# Patient Record
Sex: Male | Born: 1945 | Race: Black or African American | Hispanic: No | Marital: Married | Smoking: Never smoker
Health system: Southern US, Community
[De-identification: ages and names within clinical notes are randomized; demographics above are authoritative.]

## PROBLEM LIST (undated history)

## (undated) DIAGNOSIS — Z8711 Personal history of peptic ulcer disease: Secondary | ICD-10-CM

## (undated) DIAGNOSIS — N182 Chronic kidney disease, stage 2 (mild): Secondary | ICD-10-CM

## (undated) DIAGNOSIS — N401 Enlarged prostate with lower urinary tract symptoms: Secondary | ICD-10-CM

## (undated) DIAGNOSIS — I119 Hypertensive heart disease without heart failure: Secondary | ICD-10-CM

## (undated) DIAGNOSIS — K5730 Diverticulosis of large intestine without perforation or abscess without bleeding: Secondary | ICD-10-CM

## (undated) DIAGNOSIS — I5189 Other ill-defined heart diseases: Secondary | ICD-10-CM

## (undated) DIAGNOSIS — K219 Gastro-esophageal reflux disease without esophagitis: Secondary | ICD-10-CM

## (undated) DIAGNOSIS — E876 Hypokalemia: Secondary | ICD-10-CM

## (undated) DIAGNOSIS — N400 Enlarged prostate without lower urinary tract symptoms: Secondary | ICD-10-CM

## (undated) DIAGNOSIS — K429 Umbilical hernia without obstruction or gangrene: Secondary | ICD-10-CM

## (undated) DIAGNOSIS — F4310 Post-traumatic stress disorder, unspecified: Secondary | ICD-10-CM

## (undated) DIAGNOSIS — N529 Male erectile dysfunction, unspecified: Secondary | ICD-10-CM

## (undated) DIAGNOSIS — F329 Major depressive disorder, single episode, unspecified: Secondary | ICD-10-CM

## (undated) DIAGNOSIS — E785 Hyperlipidemia, unspecified: Secondary | ICD-10-CM

## (undated) DIAGNOSIS — K760 Fatty (change of) liver, not elsewhere classified: Secondary | ICD-10-CM

## (undated) DIAGNOSIS — F1010 Alcohol abuse, uncomplicated: Secondary | ICD-10-CM

## (undated) DIAGNOSIS — M1990 Unspecified osteoarthritis, unspecified site: Secondary | ICD-10-CM

## (undated) DIAGNOSIS — I10 Essential (primary) hypertension: Secondary | ICD-10-CM

## (undated) HISTORY — DX: Chronic kidney disease, stage 2 (mild): N18.2

## (undated) HISTORY — DX: Fatty (change of) liver, not elsewhere classified: K76.0

## (undated) HISTORY — DX: Other ill-defined heart diseases: I51.89

## (undated) HISTORY — DX: Hypokalemia: E87.6

## (undated) HISTORY — DX: Hypertensive heart disease without heart failure: I11.9

## (undated) HISTORY — PX: HERNIA REPAIR: SHX51

## (undated) HISTORY — PX: KNEE SURGERY: SHX244

## (undated) HISTORY — DX: Essential (primary) hypertension: I10

---

## 1998-03-04 HISTORY — PX: INGUINAL HERNIA REPAIR: SUR1180

## 2015-03-05 HISTORY — PX: VITRECTOMY: SHX106

## 2015-08-17 ENCOUNTER — Observation Stay (HOSPITAL_BASED_OUTPATIENT_CLINIC_OR_DEPARTMENT_OTHER)
Admission: EM | Admit: 2015-08-17 | Discharge: 2015-08-18 | Disposition: A | Discharge status: Home / Self Care | Payer: Medicare HMO | Attending: Internal Medicine | Admitting: Internal Medicine

## 2015-08-17 ENCOUNTER — Emergency Department (HOSPITAL_BASED_OUTPATIENT_CLINIC_OR_DEPARTMENT_OTHER): Payer: Medicare HMO

## 2015-08-17 ENCOUNTER — Encounter (HOSPITAL_BASED_OUTPATIENT_CLINIC_OR_DEPARTMENT_OTHER): Payer: Self-pay | Admitting: *Deleted

## 2015-08-17 DIAGNOSIS — E876 Hypokalemia: Secondary | ICD-10-CM

## 2015-08-17 DIAGNOSIS — R0789 Other chest pain: Principal | ICD-10-CM | POA: Insufficient documentation

## 2015-08-17 DIAGNOSIS — N400 Enlarged prostate without lower urinary tract symptoms: Secondary | ICD-10-CM | POA: Diagnosis not present

## 2015-08-17 DIAGNOSIS — Z79899 Other long term (current) drug therapy: Secondary | ICD-10-CM | POA: Diagnosis not present

## 2015-08-17 DIAGNOSIS — R112 Nausea with vomiting, unspecified: Secondary | ICD-10-CM | POA: Insufficient documentation

## 2015-08-17 DIAGNOSIS — R739 Hyperglycemia, unspecified: Secondary | ICD-10-CM | POA: Diagnosis not present

## 2015-08-17 DIAGNOSIS — R079 Chest pain, unspecified: Secondary | ICD-10-CM | POA: Diagnosis present

## 2015-08-17 DIAGNOSIS — I10 Essential (primary) hypertension: Secondary | ICD-10-CM

## 2015-08-17 DIAGNOSIS — E785 Hyperlipidemia, unspecified: Secondary | ICD-10-CM | POA: Insufficient documentation

## 2015-08-17 DIAGNOSIS — R072 Precordial pain: Secondary | ICD-10-CM

## 2015-08-17 DIAGNOSIS — N289 Disorder of kidney and ureter, unspecified: Secondary | ICD-10-CM | POA: Diagnosis not present

## 2015-08-17 DIAGNOSIS — R61 Generalized hyperhidrosis: Secondary | ICD-10-CM | POA: Insufficient documentation

## 2015-08-17 HISTORY — DX: Hyperlipidemia, unspecified: E78.5

## 2015-08-17 HISTORY — DX: Benign prostatic hyperplasia without lower urinary tract symptoms: N40.0

## 2015-08-17 LAB — TROPONIN I
Troponin I: <0.03 ng/mL (ref <0.031)
Troponin I: <0.03 ng/mL (ref <0.031)

## 2015-08-17 LAB — COMPREHENSIVE METABOLIC PANEL
ALBUMIN: 4.1 g/dL (ref 3.5–5.0)
ALT: 21 U/L (ref 17–63)
AST: 21 U/L (ref 15–41)
Alkaline Phosphatase: 49 U/L (ref 38–126)
Anion gap: 9 (ref 5–15)
BILIRUBIN TOTAL: 1.1 mg/dL (ref 0.3–1.2)
BUN: 21 mg/dL — AB (ref 6–20)
CHLORIDE: 108 mmol/L (ref 101–111)
CO2: 20 mmol/L — ABNORMAL LOW (ref 22–32)
CREATININE: 1.33 mg/dL — AB (ref 0.61–1.24)
Calcium: 8.9 mg/dL (ref 8.9–10.3)
GFR calc Af Amer: >60 mL/min (ref >60)
GFR, EST NON AFRICAN AMERICAN: 53 mL/min — AB (ref >60)
GLUCOSE: 110 mg/dL — AB (ref 65–99)
Potassium: 3.3 mmol/L — ABNORMAL LOW (ref 3.5–5.1)
Sodium: 137 mmol/L (ref 135–145)
TOTAL PROTEIN: 7.7 g/dL (ref 6.5–8.1)

## 2015-08-17 LAB — CBC
HCT: 39.5 % (ref 39.0–52.0)
HEMOGLOBIN: 14.3 g/dL (ref 13.0–17.0)
MCH: 32.5 pg (ref 26.0–34.0)
MCHC: 36.2 g/dL — ABNORMAL HIGH (ref 30.0–36.0)
MCV: 89.8 fL (ref 78.0–100.0)
Platelets: 247 10*3/uL (ref 150–400)
RBC: 4.4 MIL/uL (ref 4.22–5.81)
RDW: 12.3 % (ref 11.5–15.5)
WBC: 4.6 10*3/uL (ref 4.0–10.5)

## 2015-08-17 LAB — LIPASE, BLOOD: LIPASE: 25 U/L (ref 11–51)

## 2015-08-17 MED ORDER — ASPIRIN EC 325 MG PO TBEC: 325.0000 mg | DELAYED_RELEASE_TABLET | Freq: Every day | ORAL | Status: DC

## 2015-08-17 MED ORDER — CARVEDILOL 6.25 MG PO TABS: 6.2500 mg | ORAL_TABLET | Freq: Two times a day (BID) | ORAL | Status: DC

## 2015-08-17 MED ORDER — ATORVASTATIN CALCIUM 80 MG PO TABS
80.0000 mg | ORAL_TABLET | Freq: Every day | ORAL | Status: DC
  Administered 2015-08-17: 80 mg via ORAL
  Filled 2015-08-17: qty 1

## 2015-08-17 MED ORDER — ASPIRIN 81 MG PO CHEW
324.0000 mg | CHEWABLE_TABLET | Freq: Once | ORAL | Status: AC
  Administered 2015-08-17: 324 mg via ORAL
  Filled 2015-08-17: qty 4

## 2015-08-17 MED ORDER — AMLODIPINE BESYLATE 10 MG PO TABS
10.0000 mg | ORAL_TABLET | Freq: Every day | ORAL | Status: DC
  Administered 2015-08-18: 10 mg via ORAL
  Filled 2015-08-17: qty 1

## 2015-08-17 MED ORDER — NITROGLYCERIN 2 % TD OINT
1.0000 [in_us] | TOPICAL_OINTMENT | Freq: Three times a day (TID) | TRANSDERMAL | Status: DC
  Administered 2015-08-17 – 2015-08-18 (×2): 1 [in_us] via TOPICAL
  Filled 2015-08-17: qty 30

## 2015-08-17 MED ORDER — ENOXAPARIN SODIUM 80 MG/0.8ML ~~LOC~~ SOLN
1.0000 mg/kg | Freq: Once | SUBCUTANEOUS | Status: AC
Start: 2015-08-17 — End: 2015-08-17
  Administered 2015-08-17: 80 mg via SUBCUTANEOUS
  Filled 2015-08-17: qty 0.8

## 2015-08-17 MED ORDER — SODIUM CHLORIDE 0.9% FLUSH
3.0000 mL | Freq: Two times a day (BID) | INTRAVENOUS | Status: DC
  Administered 2015-08-17 – 2015-08-18 (×2): 3 mL via INTRAVENOUS

## 2015-08-17 MED ORDER — MORPHINE SULFATE (PF) 2 MG/ML IV SOLN: 1.0000 mg | INTRAVENOUS | Status: DC | PRN

## 2015-08-17 MED ORDER — ACETAMINOPHEN 650 MG RE SUPP
650.0000 mg | Freq: Four times a day (QID) | RECTAL | Status: DC | PRN
Start: 2015-08-17 — End: 2015-08-18

## 2015-08-17 MED ORDER — SODIUM CHLORIDE 0.9 % IV SOLN
INTRAVENOUS | Status: AC
  Administered 2015-08-17: 22:00:00 via INTRAVENOUS

## 2015-08-17 MED ORDER — TAMSULOSIN HCL 0.4 MG PO CAPS
0.4000 mg | ORAL_CAPSULE | Freq: Every day | ORAL | Status: DC
  Administered 2015-08-17: 0.4 mg via ORAL
  Filled 2015-08-17: qty 1

## 2015-08-17 MED ORDER — ACETAMINOPHEN 325 MG PO TABS: 650.0000 mg | ORAL_TABLET | Freq: Four times a day (QID) | ORAL | Status: DC | PRN

## 2015-08-17 MED ORDER — NITROGLYCERIN 0.4 MG SL SUBL
0.4000 mg | SUBLINGUAL_TABLET | SUBLINGUAL | Status: DC | PRN
  Administered 2015-08-17: 0.4 mg via SUBLINGUAL
  Filled 2015-08-17: qty 1

## 2015-08-17 MED ORDER — SODIUM CHLORIDE 0.9 % IV BOLUS (SEPSIS)
1000.0000 mL | Freq: Once | INTRAVENOUS | Status: AC
  Administered 2015-08-17: 1000 mL via INTRAVENOUS

## 2015-08-17 NOTE — ED Provider Notes (Signed)
CSN: [id number]     Arrival date & time 08/17/15  1446 History   First MD Initiated Contact with Patient 08/17/15 1508     Chief Complaint  Patient presents with  . Chest Pain     (Consider location/radiation/quality/duration/timing/severity/associated sxs/prior Treatment) HPI  70 year old male with a history of hypertension presents with chest pain/upper abdominal pain. Has been intermittent over the last 4 days. Today at around 1 or 1:30 PM he was at work and all of a sudden developed 7/10 pressure in his abdomen and back. He felt nauseated and vomited once and broke out into a sweat. No shortness of breath. Nothing specific seems to make the pain come and go over these last couple days. Not related to exertion. Pain seems to radiate around to his back. Patient states that it usually occurs at least 2 hours or more after eating. Never had this before. Prior history of hyperlipidemia but has been taken off his meds by his PCP. No history of diabetes or smoking. No early coronary disease in the family.  Past Medical History  Diagnosis Date  . Hypertension   . Enlarged prostate    Past Surgical History  Procedure Laterality Date  . Hernia repair     No family history on file. Social History  Substance Use Topics  . Smoking status: Never Smoker   . Smokeless tobacco: None  . Alcohol Use: Yes     Comment: weekends    Review of Systems  Constitutional: Positive for diaphoresis.  Respiratory: Negative for shortness of breath.   Cardiovascular: Positive for chest pain.  Gastrointestinal: Positive for vomiting and abdominal pain.  Musculoskeletal: Positive for back pain.  All other systems reviewed and are negative.     Allergies  Review of patient's allergies indicates not on file.  Home Medications   Prior to Admission medications   Not on File   BP 152/79 mmHg  Pulse 76  Temp(Src) 98.2 F (36.8 C) (Oral)  Resp 18  Ht  (1.702 m)  Wt 174 lb (78.926 kg)  BMI  27.25 kg/m2  SpO2 100% Physical Exam  Constitutional: He is oriented to person, place, and time. He appears well-developed and well-nourished. No distress.  HENT:  Head: Normocephalic and atraumatic.  Right Ear: External ear normal.  Left Ear: External ear normal.  Nose: Nose normal.  Eyes: Right eye exhibits no discharge. Left eye exhibits no discharge.  Neck: Neck supple.  Cardiovascular: Normal rate, regular rhythm, normal heart sounds and intact distal pulses.   Pulses:      Radial pulses are 2+ on the right side, and 2+ on the left side.  Pulmonary/Chest: Effort normal and breath sounds normal. He exhibits no tenderness.  Abdominal: Soft. There is tenderness in the epigastric area.  Musculoskeletal: He exhibits no edema.  Neurological: He is alert and oriented to person, place, and time.  Skin: Skin is warm and dry. He is not diaphoretic.  Nursing note and vitals reviewed.   ED Course  Procedures (including critical care time) Labs Review Labs Reviewed  CBC - Abnormal; Notable for the following:    MCHC 36.2 (*)    All other components within normal limits  COMPREHENSIVE METABOLIC PANEL - Abnormal; Notable for the following:    Potassium 3.3 (*)    CO2 20 (*)    Glucose, Bld 110 (*)    BUN 21 (*)    Creatinine, Ser 1.33 (*)    GFR calc non Af Amer 53 (*)  All other components within normal limits  TROPONIN I  LIPASE, BLOOD    Imaging Review Dg Chest 2 View  08/17/2015  CLINICAL DATA:  Mid chest pain for 3 days.  History of hypertension. EXAM: CHEST  2 VIEW COMPARISON:  None. FINDINGS: Heart size is normal. Overall cardiomediastinal silhouette is within normal limits. Pulmonary arteries are mildly prominent, possibly chronic pulmonary artery hypertension. Lungs are clear. No pleural effusion or pneumothorax seen. Osseous and soft tissue structures about the chest are unremarkable. IMPRESSION: 1. No acute findings.  Lungs are clear.  Heart size is normal. 2.  Questionable prominence of the pulmonary arteries, raising the possibility of a chronic pulmonary artery hypertension. Electronically Signed   By: Bary RichardStan  Maynard M.D.   On: 08/17/2015 15:53   [place] Abdomen Limited Ruq  08/17/2015  CLINICAL DATA:  Mid chest pain for 3 days. EXAM: US ABDOMEN LIMITED - RIGHT UPPER QUADRANT COMPARISON:  None. FINDINGS: Gallbladder: No gallstones seen. There is no gallbladder wall thickening, pericholecystic fluid or other secondary sonographic signs of acute cholecystitis. No sonographic Murphy's sign elicited during the examination, per the sonographer. Common bile duct: Diameter: Normal at 3 mm Liver: Diffusely echogenic suggesting fatty infiltration. Small circumscribed nearly anechoic masses are seen within the liver, most suggestive of benign cysts. There are no suspicious masses demonstrated within the liver. IMPRESSION: 1. Fatty infiltration of the liver. 2. No acute findings. Gallbladder appears normal. No bile duct dilatation. Electronically Signed   By: Bary RichardStan  Maynard M.D.   On: 08/17/2015 15:56   I have personally reviewed and evaluated these images and lab results as part of my medical decision-making.   EKG Interpretation   Date/Time:  Thursday August 17 2015 15:05:56 EDT Ventricular Rate:  79 PR Interval:  144 QRS Duration: 95 QT Interval:  377 QTC Calculation: 432 R Axis:   -6 Text Interpretation:  Sinus rhythm Probable left atrial enlargement  Probable anteroseptal infarct, recent nonspecific T waves No old tracing  to compare Confirmed by Lantz Hermann MD, Takashi Korol [phone]) on 08/17/2015 3:08:39 PM       EKG Interpretation  Date/Time:  Thursday August 17 2015 16:08:07 EDT Ventricular Rate:  78 PR Interval:  141 QRS Duration: 96 QT Interval:  382 QTC Calculation: 435 R Axis:   -18 Text Interpretation:  Sinus rhythm Probable left atrial enlargement Left ventricular hypertrophy Anterior Q waves, possibly due to LVH no significant change since earlier in the  day Confirmed by Adriell Polansky MD, Mareon Robinette ([id number]) on 08/17/2015 4:12:30 PM       MDM   Final diagnoses:  Chest pain    Patient's pain resolved with nitroglycerin and aspirin. Nonspecific changes on EKG but no old to compare to. No changes noted throughout ED stay. Could be gastric in nature given epigastric nature but with risk factors including age, nonspecific ECG, and hypertension I think he needs an ACS rule out. Especially with his vomiting and diaphoresis today. Discussed with Dr. Randol KernElgergawy, who accepts in admission and [facility]. Patient is currently pain-free in the ED.    Pricilla LovelessScott Nilza Eaker, MD 08/17/15 [phone]

## 2015-08-17 NOTE — ED Notes (Signed)
PResents with sternal chest pain began yesterday, pain is intermittent described as burning at first and then a pressure with radiation into shoulder blades, diaphoresis and nausea. Pain is worse with exertion and better with rest.

## 2015-08-17 NOTE — H&P (Addendum)
TRH H&P   Patient Demographics:    Joshua Solis, is a 70 y.o. male  MRN: [id number]   DOB - 01-08-1946  Admit Date - 08/17/2015  Outpatient Primary MD for the patient is No PCP Per Patient  Dr. Sammuel Bailiff  Referring MD/NP/PA:  Pricilla Loveless  Outpatient Specialists:  none  Patient coming from: home, ED at [facility]  Chief Complaint  Patient presents with  . Chest Pain      HPI:    Joshua Solis  is a 70 y.o. male, w hypertension, hyperlipidemia, apparently c/o cp x3 days.  Pt states that had cp starting at about 1:30pm today. SSCP " pressure"  With radiation to the neck associated with n/v x1.  Prior to that it would radiate around the right side to his shoulder blade.  Denies fever, chills, cough, palp, sob.  Sitting made the pain better. Pt was putting together a chair when the pain came on today at 1:30pm.  Took a couple of tums without relief.  + diaphoresis,  Pain lasted about 20 minutes.  Pt drove to the ED and tx with nitroglycerin, and aspirin.  Pt had relief after taking nitro.  and was sent for admission for cp.  EKG showed nsr at 80, nl axis, Q in V1, V2, ST elevation in V1, V2.     Review of systems:    In addition to the HPI above, No Fever-chills, No Headache, No changes with Vision or hearing, No problems swallowing food or Liquids, NoCough or Shortness of Breath, No Abdominal pain, No Nausea or Vommitting, Bowel movements are regular, No Blood in stool or Urine, No dysuria, No new skin rashes or bruises, No new joints pains-aches,  No new weakness, tingling, numbness in any extremity, No recent weight gain or loss, No polyuria, polydypsia or polyphagia, No significant Mental Stressors.  A full 10 point Review of Systems was done, except as stated above, all other Review of Systems were negative.   With Past History of the following :     Past Medical History  Diagnosis Date  . Hypertension   . Enlarged prostate   . Hyperlipidemia       Past Surgical History  Procedure Laterality Date  . Hernia repair        Social History:     Social History  Substance Use Topics  . Smoking status: Never Smoker   . Smokeless tobacco: Not on file  . Alcohol Use: 1.2 oz/week    2 Standard drinks or equivalent per week     Comment: weekends     Lives - at home  Mobility - walks without assistance   Family History :     Family History  Problem Relation Age of Onset  . Stroke Mother   . Pneumonia Mother       Home Medications:   Prior to  Admission medications   Medication Sig Start Date End Date Taking? Authorizing Provider  tamsulosin (FLOMAX) 0.4 MG CAPS capsule Take 0.4 mg by mouth.   Yes Historical Provider, MD     Allergies:    No Known Allergies   Physical Exam:   Vitals  Blood pressure 154/84, pulse 62, temperature 98.6 F (37 C), temperature source Oral, resp. rate 18, height  (1.727 m), weight 79.833 kg (176 lb), SpO2 100 %.   1. General  lying in bed in NAD,    2. Normal affect and insight, Not Suicidal or Homicidal, Awake Alert, Oriented X 3.  3. No F.N deficits, ALL C.Nerves Intact, Strength 5/5 all 4 extremities, Sensation intact all 4 extremities, Plantars down going.  4. Ears and Eyes appear Normal, Conjunctivae clear, PERRLA. Moist Oral Mucosa.  5. Supple Neck, No JVD, No cervical lymphadenopathy appriciated, No Carotid Bruits.  6. Symmetrical Chest wall movement, Good air movement bilaterally, CTAB.  7. RRR, No Gallops, Rubs or Murmurs, No Parasternal Heave.  8. Positive Bowel Sounds, Abdomen Soft, No tenderness, No organomegaly appriciated,No rebound -guarding or rigidity.  9.  No Cyanosis, Normal Skin Turgor, No Skin Rash or Bruise.  10. Good muscle tone,  joints appear normal , no effusions, Normal ROM.  11. No Palpable Lymph Nodes in Neck or Axilla     Data  Review:    CBC  Recent Labs Lab 08/17/15 1514  WBC 4.6  HGB 14.3  HCT 39.5  PLT 247  MCV 89.8  MCH 32.5  MCHC 36.2*  RDW 12.3   ------------------------------------------------------------------------------------------------------------------  Chemistries   Recent Labs Lab 08/17/15 1514  NA 137  K 3.3*  CL 108  CO2 20*  GLUCOSE 110*  BUN 21*  CREATININE 1.33*  CALCIUM 8.9  AST 21  ALT 21  ALKPHOS 49  BILITOT 1.1   ------------------------------------------------------------------------------------------------------------------ estimated creatinine clearance is 50.7 mL/min (by C-G formula based on Cr of 1.33). ------------------------------------------------------------------------------------------------------------------ No results for input(s): TSH, T4TOTAL, T3FREE, THYROIDAB in the last 72 hours.  Invalid input(s): FREET3  Coagulation profile No results for input(s): INR, PROTIME in the last 168 hours. ------------------------------------------------------------------------------------------------------------------- No results for input(s): DDIMER in the last 72 hours. -------------------------------------------------------------------------------------------------------------------  Cardiac Enzymes  Recent Labs Lab 08/17/15 1512  TROPONINI <0.03   ------------------------------------------------------------------------------------------------------------------ No results found for: BNP   ---------------------------------------------------------------------------------------------------------------  Urinalysis No results found for: COLORURINE, APPEARANCEUR, LABSPEC, PHURINE, GLUCOSEU, HGBUR, BILIRUBINUR, KETONESUR, PROTEINUR, UROBILINOGEN, NITRITE, LEUKOCYTESUR  ----------------------------------------------------------------------------------------------------------------   Imaging Results:    Dg Chest 2 View  08/17/2015  CLINICAL DATA:  Mid  chest pain for 3 days.  History of hypertension. EXAM: CHEST  2 VIEW COMPARISON:  None. FINDINGS: Heart size is normal. Overall cardiomediastinal silhouette is within normal limits. Pulmonary arteries are mildly prominent, possibly chronic pulmonary artery hypertension. Lungs are clear. No pleural effusion or pneumothorax seen. Osseous and soft tissue structures about the chest are unremarkable. IMPRESSION: 1. No acute findings.  Lungs are clear.  Heart size is normal. 2. Questionable prominence of the pulmonary arteries, raising the possibility of a chronic pulmonary artery hypertension. Electronically Signed   By: Bary Richard M.D.   On: 08/17/2015 15:53   US Abdomen Limited Ruq  08/17/2015  CLINICAL DATA:  Mid chest pain for 3 days. EXAM: US ABDOMEN LIMITED - RIGHT UPPER QUADRANT COMPARISON:  None. FINDINGS: Gallbladder: No gallstones seen. There is no gallbladder wall thickening, pericholecystic fluid or other secondary sonographic signs of acute cholecystitis. No sonographic Murphy's sign elicited during the examination,  per the sonographer. Common bile duct: Diameter: Normal at 3 mm Liver: Diffusely echogenic suggesting fatty infiltration. Small circumscribed nearly anechoic masses are seen within the liver, most suggestive of benign cysts. There are no suspicious masses demonstrated within the liver. IMPRESSION: 1. Fatty infiltration of the liver. 2. No acute findings. Gallbladder appears normal. No bile duct dilatation. Electronically Signed   By: Bary RichardStan  Maynard M.D.   On: 08/17/2015 15:56    My personal review of EKG: Rhythm NSR, Rate  80 / nl axis, qv1, v2, and st elevation in v1, v2   Assessment & Plan:    Principal Problem:   Chest pain Active Problems:   Essential hypertension   Renal insufficiency   Hypokalemia    1. Cp responsive to nitro Tele Trop i q6h x3 Cardiology consultation Aspirin Start Lipitor 80mg  po qhs No beta blocker due to hr low Cont amlodipine 10mg  po  qday Nitro paste 1 inch topically tid Lovenox  2. Hypokalemia Replete  3. Renal insufficiiency Hydrate with ns iv Check cmp in am  4.  Hyperglycemia Check hga1c, lipid  5.  N/v  protonix zofran prn  DVT Prophylaxis Lovenox-  SCDs  AM Labs Ordered, also please review Full Orders  Family Communication: Admission, patients condition and plan of care including tests being ordered have been discussed with the patient  who indicate understanding and agree with the plan and Code Status.  Code Status FULL CODE  Likely DC to  home  Condition GUARDED    Consults called: cardiology  Admission status: inpatient  Time spent in minutes : 45 minutes   Pearson GrippeJames Elaine Middleton M.D on 08/17/2015 at 7:18 PM  Between 7am to 7pm - Pager - [phone]. After 7pm go to [email or web address] - password [email or web address]  [facility] - Office  [phone]

## 2015-08-17 NOTE — ED Notes (Signed)
Pt's actual arrival was 14:48. EKG and vitals performed at 14:53, however; the chart the pt was admitted with was under the wrong MRN. Charge nurse made aware, issue corrected.

## 2015-08-17 NOTE — Progress Notes (Signed)
Pt to room, with no c/o CP. Meal ordered.

## 2015-08-17 NOTE — Progress Notes (Signed)
70 year old male presents with complaints of chest/epigastric pain, nausea and vomiting with diaphoresis, negative troponins, chest pain relieved by nitroglycerin, currently chest pain-free, EKG with nonspecific T waves, accepted to telemetry for chest pain workup. Huey Bienenstockawood Karin Pinedo MD

## 2015-08-17 NOTE — Consult Note (Signed)
CARDIOLOGY CONSULT NOTE  Patient ID: Joshua Solis MRN: [id number] DOB/AGE: May 26, 1945 70 y.o.  Admit date: 08/17/2015 Primary Physician No PCP Per Patient  [facility] Primary Cardiologist None Chief Complaint  Chest pain. Requesting  Dr. Selena Batten  HPI:  The patient presents for evaluation of chest pain.  He has no prior cardiac history.  He reports that since this weekend he has had intermittent chest pain.  This was an upper epigastric and lower sternal burning.  It would be moderate in intensity and would come and go at rest.  He has an active job and could not bring this on with that activity.  He took antacids and Aleve and there was not be a noticeable difference.  Today at work he felt some discomfort and actually had vomiting.  He had a sensation in his throat prior to this that was a feeling like he needed to throw up.  He had diaphoresis.  He did feel better after vomiting but he decided to come to the ED to get checked out.  He has had negative cardiac enzymes.  EKG shows a probable repolarization patter.  He has otherwise been feeling well and has recently been active without any symptoms.   He has never had this discomfort before.   Past Medical History  Diagnosis Date  . Hypertension   . Enlarged prostate   . Hyperlipidemia     Past Surgical History  Procedure Laterality Date  . Hernia repair      No Known Allergies Prescriptions prior to admission  Medication Sig Dispense Refill Last Dose  . amLODipine (NORVASC) 10 MG tablet Take 10 mg by mouth daily.   08/17/2015 at Unknown time  . tamsulosin (FLOMAX) 0.4 MG CAPS capsule Take 0.4 mg by mouth.      Family History  Problem Relation Age of Onset  . Stroke Mother   . Pneumonia Mother     Social History   Social History  . Marital Status: Married    Spouse Name: N/A  . Number of Children: N/A  . Years of Education: N/A   Occupational History  . Not on file.   Social History Main Topics  . Smoking status: Never Smoker    . Smokeless tobacco: Not on file  . Alcohol Use: 1.2 oz/week    2 Standard drinks or equivalent per week     Comment: weekends  . Drug Use: No  . Sexual Activity: Not on file   Other Topics Concern  . Not on file   Social History Narrative  . No narrative on file     ROS:    As stated in the HPI and negative for all other systems.  Physical Exam: Blood pressure 154/84, pulse 62, temperature 98.6 F (37 C), temperature source Oral, resp. rate 18, height  (1.727 m), weight 176 lb (79.833 kg), SpO2 100 %.  GENERAL:  Well appearing HEENT:  Pupils equal round and reactive, fundi not visualized, oral mucosa unremarkable NECK:  No jugular venous distention, waveform within normal limits, carotid upstroke brisk and symmetric, no bruits, no thyromegaly LYMPHATICS:  No cervical, inguinal adenopathy LUNGS:  Clear to auscultation bilaterally BACK:  No CVA tenderness CHEST:  Unremarkable HEART:  PMI not displaced or sustained,S1 and S2 within normal limits, no S3, no S4, no clicks, no rubs, no murmurs ABD:  Flat, positive bowel sounds normal in frequency in pitch, no bruits, no rebound, no guarding, no midline pulsatile mass, no hepatomegaly, no splenomegaly EXT:  2 plus pulses throughout, no edema, no cyanosis no clubbing SKIN:  No rashes no nodules NEURO:  Cranial nerves II through XII grossly intact, motor grossly intact throughout PSYCH:  Cognitively intact, oriented to person place and time   Labs: Lab Results  Component Value Date   BUN 21* 08/17/2015   Lab Results  Component Value Date   CREATININE 1.33* 08/17/2015   Lab Results  Component Value Date   NA 137 08/17/2015   K 3.3* 08/17/2015   CL 108 08/17/2015   CO2 20* 08/17/2015   Lab Results  Component Value Date   TROPONINI <0.03 08/17/2015   Lab Results  Component Value Date   WBC 4.6 08/17/2015   HGB 14.3 08/17/2015   HCT 39.5 08/17/2015   MCV 89.8 08/17/2015   PLT 247 08/17/2015   No results found  for: CHOL, HDL, LDLCALC, LDLDIRECT, TRIG, CHOLHDL Lab Results  Component Value Date   ALT 21 08/17/2015   AST 21 08/17/2015   ALKPHOS 49 08/17/2015   BILITOT 1.1 08/17/2015      Radiology:   CXR: 1. No acute findings. Lungs are clear. Heart size is normal. 2. Questionable prominence of the pulmonary arteries, raising the possibility of a chronic pulmonary artery hypertension.  EKG:  NSR, rate 78,  LAD, diffuse anterolateral ST elevation nonspecific.  08/17/2015  ASSESSMENT AND PLAN:   CHEST PAIN:  The pain has atypical greater than typical features.  No objective evidence of ischemia.  He does have cardiovascular risk factors.   I will bring the patient back for a POET (Plain Old Exercise Test). This will allow me to screen for obstructive coronary disease, risk stratify and very importantly provide a prescription for exercise.  HTN:  BP is up slightly but he reports that this is not usual.  He will follow this at home.    CKD:  Mild.  Follow.  Might be related to HTN   Signed: Rollene RotundaJames Abdirizak Richison 08/17/2015, 8:17 PM

## 2015-08-18 ENCOUNTER — Other Ambulatory Visit: Payer: Self-pay | Admitting: Physician Assistant

## 2015-08-18 ENCOUNTER — Inpatient Hospital Stay (HOSPITAL_COMMUNITY): Payer: Medicare HMO

## 2015-08-18 ENCOUNTER — Telehealth: Payer: Self-pay | Admitting: Cardiology

## 2015-08-18 ENCOUNTER — Inpatient Hospital Stay (HOSPITAL_BASED_OUTPATIENT_CLINIC_OR_DEPARTMENT_OTHER): Payer: Medicare HMO

## 2015-08-18 DIAGNOSIS — N289 Disorder of kidney and ureter, unspecified: Secondary | ICD-10-CM | POA: Diagnosis not present

## 2015-08-18 DIAGNOSIS — R0789 Other chest pain: Secondary | ICD-10-CM | POA: Diagnosis not present

## 2015-08-18 DIAGNOSIS — I10 Essential (primary) hypertension: Secondary | ICD-10-CM | POA: Diagnosis not present

## 2015-08-18 DIAGNOSIS — R079 Chest pain, unspecified: Secondary | ICD-10-CM | POA: Diagnosis not present

## 2015-08-18 DIAGNOSIS — E876 Hypokalemia: Secondary | ICD-10-CM

## 2015-08-18 LAB — LIPID PANEL
Cholesterol: 176 mg/dL (ref 0–200)
HDL: 35 mg/dL — AB (ref >40)
LDL CALC: 122 mg/dL — AB (ref 0–99)
TRIGLYCERIDES: 96 mg/dL (ref <150)
Total CHOL/HDL Ratio: 5 RATIO
VLDL: 19 mg/dL (ref 0–40)

## 2015-08-18 LAB — EXERCISE TOLERANCE TEST
CHL CUP MPHR: 151 {beats}/min
CHL CUP RESTING HR STRESS: 85 {beats}/min
CHL RATE OF PERCEIVED EXERTION: 13
CSEPEDS: 10 s
CSEPHR: 86 %
Estimated workload: 6 METS
Exercise duration (min): 4 min
Peak HR: 131 {beats}/min

## 2015-08-18 LAB — TROPONIN I

## 2015-08-18 LAB — CBC
HCT: 35.9 % — ABNORMAL LOW (ref 39.0–52.0)
Hemoglobin: 12.2 g/dL — ABNORMAL LOW (ref 13.0–17.0)
MCH: 30.3 pg (ref 26.0–34.0)
MCHC: 34 g/dL (ref 30.0–36.0)
MCV: 89.3 fL (ref 78.0–100.0)
PLATELETS: 222 10*3/uL (ref 150–400)
RBC: 4.02 MIL/uL — AB (ref 4.22–5.81)
RDW: 12.7 % (ref 11.5–15.5)
WBC: 4.9 10*3/uL (ref 4.0–10.5)

## 2015-08-18 MED ORDER — ASPIRIN EC 81 MG PO TBEC
81.0000 mg | DELAYED_RELEASE_TABLET | Freq: Every day | ORAL | Status: DC
  Administered 2015-08-18: 81 mg via ORAL
  Filled 2015-08-18: qty 1

## 2015-08-18 MED ORDER — NITROGLYCERIN 0.4 MG SL SUBL: 0.4000 mg | SUBLINGUAL_TABLET | SUBLINGUAL | Status: DC | PRN

## 2015-08-18 MED ORDER — ASPIRIN 81 MG PO TBEC: 81.0000 mg | DELAYED_RELEASE_TABLET | Freq: Every day | ORAL | Status: DC

## 2015-08-18 MED ORDER — ATORVASTATIN CALCIUM 80 MG PO TABS: 80.0000 mg | ORAL_TABLET | Freq: Every day | ORAL | Status: DC

## 2015-08-18 MED ORDER — ATORVASTATIN CALCIUM 20 MG PO TABS: 20.0000 mg | ORAL_TABLET | Freq: Every day | ORAL | Status: DC

## 2015-08-18 MED ORDER — POTASSIUM CHLORIDE CRYS ER 20 MEQ PO TBCR
40.0000 meq | EXTENDED_RELEASE_TABLET | Freq: Once | ORAL | Status: AC
  Administered 2015-08-18: 40 meq via ORAL
  Filled 2015-08-18: qty 2

## 2015-08-18 MED ORDER — ALUM & MAG HYDROXIDE-SIMETH 200-200-20 MG/5ML PO SUSP
30.0000 mL | ORAL | Status: DC | PRN
  Filled 2015-08-18: qty 30

## 2015-08-18 NOTE — Progress Notes (Addendum)
Patient: Joshua Solis / Admit Date: 08/17/2015 / Date of Encounter: 08/18/2015, 8:06 AM  Primary Cardiology : Hochrein   Subjective: Had brief recurrence of sx last night. Nurse was going to bring Maalox but sx resolved spontaneously before he even had to take it. Feeling good this AM.  Objective: Telemetry: NSR, brief ST Physical Exam: Blood pressure 127/74, pulse 69, temperature 98.7 F (37.1 C), temperature source Oral, resp. rate 16, height  (1.727 m), weight 171 lb 8 oz (77.792 kg), SpO2 100 %. General: Well developed, well nourished AAM in no acute distress Head: Normocephalic, atraumatic, sclera non-icteric, no xanthomas, nares are without discharge. Neck: Negative for carotid bruits. JVP not elevated. Lungs: Clear bilaterally to auscultation without wheezes, rales, or rhonchi. Breathing is unlabored. Heart: RRR S1 S2 without murmurs, rubs, or gallops.  Abdomen: Soft, non-tender, non-distended with normoactive bowel sounds. No rebound/guarding. Extremities: No clubbing or cyanosis. No edema. Distal pedal pulses are 2+ and equal bilaterally. Neuro: Alert and oriented X 3. Moves all extremities spontaneously. Psych:  Responds to questions appropriately with a normal affect.   Intake/Output Summary (Last 24 hours) at 08/18/15 0806 Last data filed at 08/17/15 2100  Gross per 24 hour  Intake   1240 ml  Output      0 ml  Net   1240 ml    Inpatient Medications:  . amLODipine  10 mg Oral Daily  . aspirin EC  81 mg Oral Daily  . atorvastatin  80 mg Oral QHS  . potassium chloride  40 mEq Oral Once  . sodium chloride flush  3 mL Intravenous Q12H  . tamsulosin  0.4 mg Oral QHS   Infusions:  . sodium chloride 50 mL/hr at 08/17/15 2137    Labs:  Recent Labs  08/17/15 1514  NA 137  K 3.3*  CL 108  CO2 20*  GLUCOSE 110*  BUN 21*  CREATININE 1.33*  CALCIUM 8.9    Recent Labs  08/17/15 1514  AST 21  ALT 21  ALKPHOS 49  BILITOT 1.1  PROT 7.7  ALBUMIN 4.1     Recent Labs  08/17/15 1514 08/18/15 0228  WBC 4.6 4.9  HGB 14.3 12.2*  HCT 39.5 35.9*  MCV 89.8 89.3  PLT 247 222    Recent Labs  08/17/15 1512 08/17/15 2102 08/18/15 0228  TROPONINI <0.03 <0.03 <0.03   Invalid input(s): POCBNP No results for input(s): HGBA1C in the last 72 hours.   Radiology/Studies:  Dg Chest 2 View  08/17/2015  CLINICAL DATA:  Mid chest pain for 3 days.  History of hypertension. EXAM: CHEST  2 VIEW COMPARISON:  None. FINDINGS: Heart size is normal. Overall cardiomediastinal silhouette is within normal limits. Pulmonary arteries are mildly prominent, possibly chronic pulmonary artery hypertension. Lungs are clear. No pleural effusion or pneumothorax seen. Osseous and soft tissue structures about the chest are unremarkable. IMPRESSION: 1. No acute findings.  Lungs are clear.  Heart size is normal. 2. Questionable prominence of the pulmonary arteries, raising the possibility of a chronic pulmonary artery hypertension. Electronically Signed   By: Bary Richard M.D.   On: 08/17/2015 15:53   US Abdomen Limited Ruq  08/17/2015  CLINICAL DATA:  Mid chest pain for 3 days. EXAM: US ABDOMEN LIMITED - RIGHT UPPER QUADRANT COMPARISON:  None. FINDINGS: Gallbladder: No gallstones seen. There is no gallbladder wall thickening, pericholecystic fluid or other secondary sonographic signs of acute cholecystitis. No sonographic Murphy's sign elicited during the examination, per the sonographer. Common bile  duct: Diameter: Normal at 3 mm Liver: Diffusely echogenic suggesting fatty infiltration. Small circumscribed nearly anechoic masses are seen within the liver, most suggestive of benign cysts. There are no suspicious masses demonstrated within the liver. IMPRESSION: 1. Fatty infiltration of the liver. 2. No acute findings. Gallbladder appears normal. No bile duct dilatation. Electronically Signed   By: Bary RichardStan  Maynard M.D.   On: 08/17/2015 15:56     Assessment and Plan  Joshua Solis is  a 42M with history of HTN & HLD who presented with intermittent chest pain. R/o for MI. Cr 1.33, no prior to compare to.  1. Chest pain - troponins negative. Plan ETT today. The order was listed somehow for 9pm tonight; have asked nurse to call down to treadmill to make sure they have patient on schedule. D/c NTG paste. Decrease ASA to 81mg  daily. CXR raises question of PAH - 2D echo pending to assess PA pressures. Note he is not tachycardic, tachypneic or hypoxic. If ETT is normal we can probably do this as OP.   2. Hyperlipidemia - LDL 122. If ETT negative can de-escalate statin and f/u PCP for this.  3. HTN - continue amlodipine. Follow.  4. Hypokalemia - replete with 40meq x 1. This can be followed up by IM/PCP.  5. ?Possible CKD stage III - no prior Cr to compare to.  SignedRonie Spies, Dayna Dunn PA-C Pager: [phone]  Attending Note:   The patient was seen and examined.  Agree with assessment and plan as noted above.  Changes made to the above note as needed.  Patient seen and independently examined with Ronie Spiesayna Dunn, PA .   We discussed all aspects of the encounter. I agree with the assessment and plan as stated above.  Pt presents with atypical CP. Troponin is negative.  Will get an echo as an outpatient  to evaluate LV function and assess PA pressures - his CXR was concerning for p-HTN.  Will likely go home today if the GXT is normal     I have spent a total of 35 minutes with patient reviewing hospital  notes , telemetry, EKGs, labs and examining patient as well as establishing an assessment and plan that was discussed with the patient. > 50% of time was spent in direct patient care.    Vesta MixerPhilip J. Yanessa Hocevar, Montez HagemanJr., MD, [facility] 08/18/2015, 9:14 AM [address],  [address] Office [phone] Pager [phone]

## 2015-08-18 NOTE — Discharge Summary (Signed)
Physician Discharge Summary   Patient ID: Joshua Solis MRN: [id number] DOB/AGE: 69-Jul-1947 69 y.o.  Admit date: 08/17/2015 Discharge date: 08/18/2015  Primary Care Physician: Dr Dorthey Sawyer  Discharge Diagnoses:    . Chest pain   Hypertension   Hyperlipidemia   BPH  Consults:  Cardiology  Recommendations for Outpatient Follow-up:  1. Outpatient 2-D echo scheduled on 6/21 at 11:30 AM 2. Please repeat CBC/BMET at next visit Cardiology follow-up scheduled on 09/26/15   DIET: Heart healthy diet    Allergies:  No Known Allergies   DISCHARGE MEDICATIONS: Current Discharge Medication List    START taking these medications   Details  aspirin 81 MG EC tablet Take 1 tablet (81 mg total) by mouth daily. Qty: 30 tablet, Refills: 3    atorvastatin (LIPITOR) 20 MG tablet Take 1 tablet (20 mg total) by mouth at bedtime. Qty: 20 tablet, Refills: 3    nitroGLYCERIN (NITROSTAT) 0.4 MG SL tablet Place 1 tablet (0.4 mg total) under the tongue every 5 (five) minutes as needed for chest pain. Qty: 30 tablet, Refills: 12      CONTINUE these medications which have NOT CHANGED   Details  amLODipine (NORVASC) 10 MG tablet Take 10 mg by mouth daily.    calcium carbonate (TUMS - DOSED IN MG ELEMENTAL CALCIUM) 500 MG chewable tablet Chew 1 tablet by mouth daily.    cetirizine (ZYRTEC) 10 MG tablet Take 10 mg by mouth daily.    ibuprofen (ADVIL,MOTRIN) 200 MG tablet Take 400 mg by mouth every 6 (six) hours as needed for moderate pain.    tamsulosin (FLOMAX) 0.4 MG CAPS capsule Take 0.4 mg by mouth daily.          Brief H and P: For complete details please refer to admission H and P, but in brief Patient is a 70 year old male with hypertension, hyperlipidemia presented with chest pain intermittently for last 3 days prior to admission. Patient reported that the chest pain started about 1:30 PM on the day of admission when he was at work, with nausea, vomiting 1, diaphoresis. Patient was  admitted for further workup.  Hospital Course:    Atypical Chest pain: Risk factors include hypertension, hyperlipidemia, age - Serial troponins were negative. Patient was started on aspirin and statin - Patient was seen by cardiology, recommended exercise tolerance stress test which was negative. - Outpatient 2-D echo is scheduled and follow-up with cardiology. He was cleared by cardiology to be discharged home. Abdominal ultrasound showed fatty liver otherwise no gallstones  Active Problems:   Essential hypertension - Currently stable    Mild acute Renal insufficiency - Creatinine 1.3 at the time of admission, patient was gently hydrated    Hypokalemia - Was replaced  Hyperlipidemia LDL 122, patient was placed on Lipitor 20 mg daily   Day of Discharge BP 127/74 mmHg  Pulse 69  Temp(Src) 98.7 F (37.1 C) (Oral)  Resp 16  Ht  (1.727 m)  Wt 77.792 kg (171 lb 8 oz)  BMI 26.08 kg/m2  SpO2 100%  Physical Exam: General: Alert and awake oriented x3 not in any acute distress. HEENT: anicteric sclera, pupils reactive to light and accommodation CVS: S1-S2 clear no murmur rubs or gallops Chest: clear to auscultation bilaterally, no wheezing rales or rhonchi Abdomen: soft nontender, nondistended, normal bowel sounds Extremities: no cyanosis, clubbing or edema noted bilaterally Neuro: Cranial nerves II-XII intact, no focal neurological deficits   The results of significant diagnostics from this hospitalization (including imaging, microbiology, ancillary  and laboratory) are listed below for reference.    LAB RESULTS: Basic Metabolic Panel:  Recent Labs Lab 08/17/15 1514  NA 137  K 3.3*  CL 108  CO2 20*  GLUCOSE 110*  BUN 21*  CREATININE 1.33*  CALCIUM 8.9   Liver Function Tests:  Recent Labs Lab 08/17/15 1514  AST 21  ALT 21  ALKPHOS 49  BILITOT 1.1  PROT 7.7  ALBUMIN 4.1    Recent Labs Lab 08/17/15 1514  LIPASE 25   No results for input(s):  AMMONIA in the last 168 hours. CBC:  Recent Labs Lab 08/17/15 1514 08/18/15 0228  WBC 4.6 4.9  HGB 14.3 12.2*  HCT 39.5 35.9*  MCV 89.8 89.3  PLT 247 222   Cardiac Enzymes:  Recent Labs Lab 08/18/15 0228 08/18/15 0822  TROPONINI <0.03 <0.03   BNP: Invalid input(s): POCBNP CBG: No results for input(s): GLUCAP in the last 168 hours.  Significant Diagnostic Studies:  Dg Chest 2 View  08/17/2015  CLINICAL DATA:  Mid chest pain for 3 days.  History of hypertension. EXAM: CHEST  2 VIEW COMPARISON:  None. FINDINGS: Heart size is normal. Overall cardiomediastinal silhouette is within normal limits. Pulmonary arteries are mildly prominent, possibly chronic pulmonary artery hypertension. Lungs are clear. No pleural effusion or pneumothorax seen. Osseous and soft tissue structures about the chest are unremarkable. IMPRESSION: 1. No acute findings.  Lungs are clear.  Heart size is normal. 2. Questionable prominence of the pulmonary arteries, raising the possibility of a chronic pulmonary artery hypertension. Electronically Signed   By: Bary Richard M.D.   On: 08/17/2015 15:53   US Abdomen Limited Ruq  08/17/2015  CLINICAL DATA:  Mid chest pain for 3 days. EXAM: US ABDOMEN LIMITED - RIGHT UPPER QUADRANT COMPARISON:  None. FINDINGS: Gallbladder: No gallstones seen. There is no gallbladder wall thickening, pericholecystic fluid or other secondary sonographic signs of acute cholecystitis. No sonographic Murphy's sign elicited during the examination, per the sonographer. Common bile duct: Diameter: Normal at 3 mm Liver: Diffusely echogenic suggesting fatty infiltration. Small circumscribed nearly anechoic masses are seen within the liver, most suggestive of benign cysts. There are no suspicious masses demonstrated within the liver. IMPRESSION: 1. Fatty infiltration of the liver. 2. No acute findings. Gallbladder appears normal. No bile duct dilatation. Electronically Signed   By: Bary Richard M.D.    On: 08/17/2015 15:56     Disposition and Follow-up:    DISPOSITION: Home   DISCHARGE FOLLOW-UP Follow-up Information    Schedule an appointment as soon as possible for a visit in 2 weeks to follow up.   Why:  for hospital follow-up   Contact information:   please follow-up with your PCP/primary physician      Follow up with [facility].   Specialty:  Cardiology   Why:  [facility] - 08/23/15 at 11:30am for your heart ultrasound test   Contact information:   [address] [place] [address] [phone]      Follow up with Laurann Montana, PA-C.   Specialties:  Cardiology, Radiology   Why:  [facility] - 09/26/15 at 8am for follow-up appointment. Please monitor your blood pressure occasionally at home. Call your doctor if you tend to get readings of greater than 130 on the top number or 80 on the bottom number.   Contact information:   [address] [place] [address] [phone]  Time spent on Discharge: 25 minutes  Signed:   Joana Nolton M.D. [facility] 08/18/2015, 11:14 AM Pager: [phone]

## 2015-08-18 NOTE — Telephone Encounter (Signed)
Note opened in error.

## 2015-08-18 NOTE — Progress Notes (Signed)
ETT negative per review with Dr. Elease HashimotoNahser. 2D Echo can be done as outpatient - 6/21 @ 11:30am. F/u with me 09/26/15 at 8am.  Ronie Spiesayna Dunn PA-C

## 2015-08-18 NOTE — Care Management Obs Status (Signed)
MEDICARE OBSERVATION STATUS NOTIFICATION   Patient Details  Name: Joshua Solis MRN: [id number] Date of Birth: 1945/07/18   Medicare Observation Status Notification Given:  Yes    Gala LewandowskyGraves-Bigelow, Teauna Dubach Kaye, RN 08/18/2015, 11:33 AM

## 2015-08-19 LAB — HEMOGLOBIN A1C
HEMOGLOBIN A1C: 5.6 % (ref 4.8–5.6)
Mean Plasma Glucose: 114 mg/dL

## 2015-08-23 ENCOUNTER — Other Ambulatory Visit (HOSPITAL_COMMUNITY): Payer: Medicare HMO

## 2015-09-04 ENCOUNTER — Ambulatory Visit (HOSPITAL_COMMUNITY): Payer: Medicare HMO | Attending: Cardiovascular Disease

## 2015-09-04 ENCOUNTER — Other Ambulatory Visit: Payer: Self-pay

## 2015-09-04 DIAGNOSIS — E785 Hyperlipidemia, unspecified: Secondary | ICD-10-CM | POA: Insufficient documentation

## 2015-09-04 DIAGNOSIS — I119 Hypertensive heart disease without heart failure: Secondary | ICD-10-CM | POA: Insufficient documentation

## 2015-09-04 DIAGNOSIS — R079 Chest pain, unspecified: Secondary | ICD-10-CM | POA: Insufficient documentation

## 2015-09-04 LAB — ECHOCARDIOGRAM COMPLETE
CHL CUP MV DEC (S): 327
CHL CUP RV SYS PRESS: 26 mmHg
CHL CUP TV REG PEAK VELOCITY: 239 cm/s
E decel time: 327 msec
E/e' ratio: 6.34
FS: 41 % (ref 28–44)
IVS/LV PW RATIO, ED: 1.21
LA ID, A-P, ES: 35 mm
LA diam index: 1.8 cm/m2
LA vol A4C: 27 ml
LA vol index: 19 mL/m2
LA vol: 37 mL
LEFT ATRIUM END SYS DIAM: 35 mm
LV E/e'average: 6.34
LV PW d: 12 mm — AB (ref 0.6–1.1)
LV TDI E'MEDIAL: 6.36
LVEEMED: 6.34
LVELAT: 8.88 cm/s
LVOT SV: 74 mL
LVOT VTI: 17.9 cm
LVOT area: 4.15 cm2
LVOT diameter: 23 mm
LVOT peak vel: 97.4 cm/s
Lateral S' vel: 13 cm/s
MV pk E vel: 56.3 m/s
MVPKAVEL: 66.6 m/s
TDI e' lateral: 8.88
TR max vel: 239 cm/s

## 2015-09-07 ENCOUNTER — Telehealth: Payer: Self-pay | Admitting: Physician Assistant

## 2015-09-07 NOTE — Telephone Encounter (Signed)
Notified the pt that of echo results and recommendations per [facility] PA-C, as mentioned below.  Informed the pt that Kriste BasqueDayna will see him in follow-up on 09/26/15 at 0800. Pt verbalized understanding and agrees with this plan.      Notes Recorded by Laurann Montanaayna N Dunn, PA-C on 09/04/2015 at 4:01 PM Please call patient. Heart ultrasound showed normal squeeze function EF 60-65%. Heart is stiff and thickened likely due to his high blood pressure overtime. Important to control BP and limit sodium intake to less than 2g daily, monitor for signs of fluid overload. F/u as planned. Dayna Dunn PA-C

## 2015-09-07 NOTE — Telephone Encounter (Signed)
New message   Pt verbalized that he is returning call   He said it may be pertaining to his ECHO

## 2015-09-25 ENCOUNTER — Encounter: Payer: Self-pay | Admitting: Physician Assistant

## 2015-09-25 NOTE — Progress Notes (Signed)
Cardiology Office Note    Date:  09/26/2015  ID:  Joshua Solis, DOB 1945/06/08, MRN [id number] PCP:  No PCP Per Patient  Cardiologist:  Hochrein   Chief Complaint: f/u chest pain  History of Present Illness:  Joshua Solis is a 70 y.o. male with history of HTN, HLD, BPH, possible CKD stage III who presents for post-hospital follow-up. He was recently admitted for chest pain and ruled out for MI. Abudominal US showed fatty liver, otherwise no gallstones. He had hypokalemia which was repleted. He underwent ETT going 4:10 which showed nonspecific ST changes in V5-V6 which were not felt c/w ischemia; ETT felt to be negative. He had hypertensive response to exercise but had not taken his amlodipine prior to the test. LDL 122 - placed on Lipitor. There was some question of PAH on CXR thus f/u 2D echo as outpatient was obtained 09/04/15 showing mod LVH, EF 60-65%, grade 1 DD.   He returns for follow-up feeling great. No further CP or SOB. No LEE. He is planning on getting a new arm BP cuff because he is not sure his wrist cuff is accurate. He does not yet have a PCP but his insurance gave him a list of local ones he can make an appointment with. He reports occasional leg cramps - occur at random, not necessarily claudication-like.  Past Medical History:  Diagnosis Date  . Enlarged prostate   . Essential hypertension   . Hyperlipidemia   . Hypertensive heart disease   . Hypokalemia   . Renal insufficiency    a. Cr 1.3 during 09/2015 adm, chronicity unclear at that time.    Past Surgical History:  Procedure Laterality Date  . HERNIA REPAIR     Inguinal     Current Medications: Current Outpatient Prescriptions  Medication Sig Dispense Refill  . amLODipine (NORVASC) 10 MG tablet Take 10 mg by mouth daily.    Marland Kitchen aspirin 81 MG EC tablet Take 1 tablet (81 mg total) by mouth daily. 30 tablet 3  . atorvastatin (LIPITOR) 20 MG tablet Take 1 tablet (20 mg total) by mouth at bedtime. 20 tablet 3  .  calcium carbonate (TUMS - DOSED IN MG ELEMENTAL CALCIUM) 500 MG chewable tablet Chew 1 tablet by mouth daily.    . cetirizine (ZYRTEC) 10 MG tablet Take 10 mg by mouth daily.    Marland Kitchen ibuprofen (ADVIL,MOTRIN) 200 MG tablet Take 400 mg by mouth every 6 (six) hours as needed for moderate pain.    . nitroGLYCERIN (NITROSTAT) 0.4 MG SL tablet Place 0.4 mg under the tongue every 5 (five) minutes as needed for chest pain (x 3 doses).    . tamsulosin (FLOMAX) 0.4 MG CAPS capsule Take 0.4 mg by mouth daily.      No current facility-administered medications for this visit.      Allergies:   Review of patient's allergies indicates no known allergies.   Social History   Social History  . Marital status: Married    Spouse name: N/A  . Number of children: 2  . Years of education: N/A   Occupational History  . Makes furniture    Social History Main Topics  . Smoking status: Never Smoker  . Smokeless tobacco: None  . Alcohol use 1.2 oz/week    2 Standard drinks or equivalent per week     Comment: weekends  . Drug use: No  . Sexual activity: Not Asked   Other Topics Concern  . None   Social History  Narrative   Lives at home with wife.       Family History:  The patient's family history includes Pneumonia in his mother; Stroke (age of onset: 29) in his mother.  ROS:   Please see the history of present illness. No abd pain. All other systems are reviewed and otherwise negative.    PHYSICAL EXAM:   VS:  BP 140/72 (BP Location: Right Arm)   Ht  (1.727 m)   Wt 174 lb 1.9 oz (79 kg)   SpO2 98%   BMI 26.47 kg/m   BMI: Body mass index is 26.47 kg/m. GEN: Well nourished, well developed AAM, in no acute distress  HEENT: normocephalic, atraumatic Neck: no JVD, carotid bruits, or masses Cardiac: RRR; no murmurs, rubs, or gallops, no edema  Respiratory:  clear to auscultation bilaterally, normal work of breathing GI: soft, nontender, nondistended, + BS MS: no deformity or atrophy    Skin: warm and dry, no rash Neuro:  Alert and Oriented x 3, Strength and sensation are intact, follows commands Psych: euthymic mood, full affect  Wt Readings from Last 3 Encounters:  09/26/15 174 lb 1.9 oz (79 kg)  08/18/15 171 lb 8 oz (77.8 kg)      Studies/Labs Reviewed:   EKG:   EKG was not ordered today.  Recent Labs: 08/17/2015: ALT 21; BUN 21; Creatinine, Ser 1.33; Potassium 3.3; Sodium 137 08/18/2015: Hemoglobin 12.2; Platelets 222   Lipid Panel    Component Value Date/Time   CHOL 176 08/18/2015 0228   TRIG 96 08/18/2015 0228   HDL 35 (L) 08/18/2015 0228   CHOLHDL 5.0 08/18/2015 0228   VLDL 19 08/18/2015 0228   LDLCALC 122 (H) 08/18/2015 0228    Additional studies/ records that were reviewed today include: Summarized above.    ASSESSMENT & PLAN:   1. Chest pain - resolved. ETT as above. Continue observation for recurrent sx. 2. Hypertensive heart disease - discussed importance of low sodium diet and BP control. 3. Essential HTN - continue current regimen for now. Reduce dietary sodium intake. He will follow BP at home regularly and call if running >140 systolic. This was 127/74 at discharge from the hospital. 4. Hyperlipidemia - recently started on Lipitor. He does not yet have a PCP. I advised that he will need lipids/LFTs in about 6 weeks - I told him if he cannot get in to establish with PCP before then that he can call our office and we can check here. 5. Renal insufficiency of unknown chronicity with hypokalemia - f/u BMET today to establish chronicity and recheck potassium.  Disposition: F/u with Dr. Antoine Poche in 1 year.   Medication Adjustments/Labs and Tests Ordered: Current medicines are reviewed at length with the patient today.  Concerns regarding medicines are outlined above. Medication changes, Labs and Tests ordered today are summarized above and listed in the Patient Instructions accessible in Encounters.   Thomasene Mohair PA-C  09/26/2015  7:49 AM    [facility] [address] [place], [place], [place]  [address] Phone: [phone]; Fax: [phone]

## 2015-09-26 ENCOUNTER — Encounter (INDEPENDENT_AMBULATORY_CARE_PROVIDER_SITE_OTHER): Payer: Self-pay

## 2015-09-26 ENCOUNTER — Ambulatory Visit (INDEPENDENT_AMBULATORY_CARE_PROVIDER_SITE_OTHER): Payer: Medicare HMO | Admitting: Physician Assistant

## 2015-09-26 ENCOUNTER — Encounter: Payer: Self-pay | Admitting: Physician Assistant

## 2015-09-26 VITALS — BP 140/72 | Ht 68.0 in | Wt 174.1 lb

## 2015-09-26 DIAGNOSIS — N189 Chronic kidney disease, unspecified: Secondary | ICD-10-CM

## 2015-09-26 DIAGNOSIS — R079 Chest pain, unspecified: Secondary | ICD-10-CM

## 2015-09-26 DIAGNOSIS — I119 Hypertensive heart disease without heart failure: Secondary | ICD-10-CM

## 2015-09-26 DIAGNOSIS — I10 Essential (primary) hypertension: Secondary | ICD-10-CM | POA: Diagnosis not present

## 2015-09-26 DIAGNOSIS — E785 Hyperlipidemia, unspecified: Secondary | ICD-10-CM | POA: Diagnosis not present

## 2015-09-26 LAB — BASIC METABOLIC PANEL
BUN: 13 mg/dL (ref 7–25)
CHLORIDE: 109 mmol/L (ref 98–110)
CO2: 21 mmol/L (ref 20–31)
CREATININE: 1.18 mg/dL (ref 0.70–1.25)
Calcium: 8.8 mg/dL (ref 8.6–10.3)
Glucose, Bld: 90 mg/dL (ref 65–99)
POTASSIUM: 3.7 mmol/L (ref 3.5–5.3)
Sodium: 140 mmol/L (ref 135–146)

## 2015-09-26 NOTE — Patient Instructions (Signed)
Medication Instructions:  Your physician recommends that you continue on your current medications as directed. Please refer to the Current Medication list given to you today.   Labwork: TODAY:  BMET  Testing/Procedures: None ordered  Follow-Up: Your physician wants you to follow-up in: 1 YEAR WITH DR. HOCHREIN.  You will receive a reminder letter in the mail two months in advance. If you don't receive a letter, please call our office to schedule the follow-up appointment.    Any Other Special Instructions Will Be Listed Below (If Applicable).  1.  Keep a check on your blood pressure.  If it starts running GREATER than 140-145 on the top, regularly, then please call our office.  2.  Follow-up with your PCP for Cholesterol.  It needs to be rechecked in about 6 weeks.  If they cannot see you before then, please call our office.    If you need a refill on your cardiac medications before your next appointment, please call your pharmacy.

## 2016-05-21 ENCOUNTER — Encounter: Payer: Self-pay | Admitting: Physician Assistant

## 2016-05-24 ENCOUNTER — Encounter: Payer: Self-pay | Admitting: Physician Assistant

## 2016-06-07 ENCOUNTER — Ambulatory Visit: Payer: Medicare HMO | Admitting: Physician Assistant

## 2016-06-07 ENCOUNTER — Encounter: Payer: Self-pay | Admitting: Physician Assistant

## 2016-06-07 NOTE — Progress Notes (Deleted)
Cardiology Office Note    Date:  06/07/2016  ID:  Joshua Solis, DOB [birth year], MRN [id number] PCP:  No PCP Per Patient  Cardiologist:  Dr. Antoine Poche   Chief Complaint: heart palpitations  History of Present Illness:  Joshua Solis is a 71 y.o. 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 He had remote admission 08/2015 for chest pain - underwent ETT going 4:10 which showed nonspecific ST changes in V5-V6 which were not felt c/w ischemia; ETT felt to be negative. He had hypertensive response to exercise but had not taken his amlodipine prior to the test. LDL 122 - placed on Lipitor. There was some question of PAH on CXR thus f/u 2D echo as outpatient was obtained 09/04/15 showing mod LVH, EF 60-65%, grade 1 DD, normal PASP.  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 Diastolic dysfunction without heart failure   . Enlarged prostate   . Essential hypertension   . Fatty liver   . Hyperlipidemia   . Hypertensive heart disease   . Hypokalemia   . Renal insufficiency    a. Cr 1.3 during 09/2015 adm, chronicity unclear at that time.    Past Surgical History:  Procedure Laterality Date  . HERNIA REPAIR     Inguinal     Current Medications: Current Outpatient Prescriptions  Medication Sig Dispense Refill  . amLODipine (NORVASC) 10 MG tablet Take 10 mg by mouth daily.    Marland Kitchen aspirin 81 MG EC tablet Take 1 tablet (81 mg total) by mouth daily. 30 tablet 3  . atorvastatin (LIPITOR) 20 MG tablet Take 1 tablet (20 mg total) by mouth at bedtime. 20 tablet 3  . calcium carbonate (TUMS - DOSED IN MG ELEMENTAL CALCIUM) 500 MG chewable tablet Chew 1 tablet by mouth daily.    . cetirizine (ZYRTEC) 10 MG tablet Take 10 mg by mouth daily.    Marland Kitchen ibuprofen (ADVIL,MOTRIN)  200 MG tablet Take 400 mg by mouth every 6 (six) hours as needed for moderate pain.    . nitroGLYCERIN (NITROSTAT) 0.4 MG SL tablet Place 0.4 mg under the tongue every 5 (five) minutes as needed for chest pain (x 3 doses).    . tamsulosin (FLOMAX) 0.4 MG CAPS capsule Take 0.4 mg by mouth daily.      No current facility-administered medications for this visit.      Allergies:   Patient has no known allergies.   Social History   Social History  . Marital status: Married    Spouse name: N/A  . Number of children: 2  . Years of education: N/A   Occupational History  . Makes furniture    Social History Main Topics  . Smoking status: Never Smoker  . Smokeless tobacco: Never Used  . Alcohol use 1.2 oz/week    2 Standard drinks or equivalent per week     Comment: weekends  . Drug use: No  . Sexual activity: Not on file   Other Topics Concern  . Not on file   Social History Narrative   Lives at home with wife.       Family History:  Family History  Problem Relation Age of Onset  . Stroke Mother 87  . Pneumonia Mother    ***  ROS:   Please see the history of present  illness. Otherwise, review of systems is positive for ***.  All other systems are reviewed and otherwise negative.    PHYSICAL EXAM:   VS:  There were no vitals taken for this visit.  BMI: There is no height or weight on file to calculate BMI. GEN: Well nourished, well developed, in no acute distress  HEENT: normocephalic, atraumatic Neck: no JVD, carotid bruits, or masses Cardiac: ***RRR; no murmurs, rubs, or gallops, no edema  Respiratory:  clear to auscultation bilaterally, normal work of breathing GI: soft, nontender, nondistended, + BS MS: no deformity or atrophy  Skin: warm and dry, no rash Neuro:  Alert and Oriented x 3, Strength and sensation are intact, follows commands Psych: euthymic mood, full affect  Wt Readings from Last 3 Encounters:  09/26/15 174 lb 1.9 oz (79 kg)  08/18/15 171 lb 8 oz  (77.8 kg)      Studies/Labs Reviewed:   EKG:  EKG was ordered today and personally reviewed by me and demonstrates *** EKG was not ordered today.***  Recent Labs: 08/17/2015: ALT 21 08/18/2015: Hemoglobin 12.2; Platelets 222 09/26/2015: BUN 13; Creat 1.18; Potassium 3.7; Sodium 140   Lipid Panel    Component Value Date/Time   CHOL 176 08/18/2015 0228   TRIG 96 08/18/2015 0228   HDL 35 (L) 08/18/2015 0228   CHOLHDL 5.0 08/18/2015 0228   VLDL 19 08/18/2015 0228   LDLCALC 122 (H) 08/18/2015 0228    Additional studies/ records that were reviewed today include: Summarized above.***    ASSESSMENT & PLAN:   1. ***  Disposition: F/u with ***   Medication Adjustments/Labs and Tests Ordered: Current medicines are reviewed at length with the patient today.  Concerns regarding medicines are outlined above. Medication changes, Labs and Tests ordered today are summarized above and listed in the Patient Instructions accessible in Encounters.   Thomasene Mohair PA-C  06/07/2016 12:45 PM    [facility] [address] [place], [place], [place]  [address] Phone: [phone]; Fax: [phone]

## 2016-06-25 NOTE — Progress Notes (Signed)
Cardiology Office Note    Date:  06/27/2016  ID:  Joshua Solis, DOB [birth year], MRN [id number] PCP:  No PCP Per Patient  Cardiologist:  Hochrein   Chief [facility]"  History of Present Illness:  Joshua Solis is a 71 y.o. 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 He had remote admission 08/2015 for chest pain - underwent ETT going 4:10 which showed nonspecific ST changes in V5-V6 which were not felt c/w ischemia; ETT felt to be negative. He had hypertensive response to exercise but had not taken his amlodipine prior to the test. LDL 122 - placed on Lipitor. There was some question of PAH on CXR thus f/u 2D echo as outpatient was obtained 09/04/15 showing mod LVH, EF 60-65%, grade 1 DD, normal PASP.   He is here today for a second opinion on whether he needs a heart catheterization. Unfortunately much of this decision making seems to depend on records that are unavailable to me today. He states that earlier this year he had gone to the [place] for follow-up. At that time he had mentioned the symptoms he had had over the summer. It sounds like a nuclear stress test was done. He was told it showed a possible blockage. He was asked to go down to the [facility] for a cath. When he got there that day he could not find a parking spot. A security guard suggested he try another day. He called his doctor back to reschedule and was then told it was cancelled. He comes in today for a second opinion because he's not really sure he needs one. He has not had any further chest pain. No SOB. He works at [employer] and is very active at his job and has not had any functional limitation. He does report mild orthostasis type dizziness upon standing that resolves quickly. No syncope. Although his appointment was listed as "racing heart," the patient  does not know where this came from because he has not had any palpitations either. In general he feels he's been doing great.    Past Medical History:  Diagnosis Date  . CKD (chronic kidney disease), stage II    a. Cr 1.3 during 09/2015 adm, chronicity unclear at that time; f/u value 1.18.  . Diastolic dysfunction without heart failure   . Enlarged prostate   . Essential hypertension   . Fatty liver   . Hyperlipidemia   . Hypertensive heart disease   . Hypokalemia     Past Surgical History:  Procedure Laterality Date  . HERNIA REPAIR     Inguinal     Current Medications: Current Outpatient Prescriptions  Medication Sig Dispense Refill  . amLODipine (NORVASC) 10 MG tablet Take 10 mg by mouth daily.    Marland Kitchen aspirin 81 MG EC tablet Take 1 tablet (81 mg total) by mouth daily. 30 tablet 3  . atorvastatin (LIPITOR) 20 MG tablet Take 1 tablet (20 mg total) by mouth at bedtime. 20 tablet 3  . calcium carbonate (TUMS - DOSED IN MG ELEMENTAL CALCIUM) 500 MG chewable tablet Chew 1 tablet by mouth daily.    . cetirizine (ZYRTEC) 10 MG tablet Take 10 mg by mouth daily.    Marland Kitchen ibuprofen (ADVIL,MOTRIN) 200 MG tablet Take 400 mg by mouth every 6 (six) hours as needed for moderate pain.    . nitroGLYCERIN (  NITROSTAT) 0.4 MG SL tablet Place 0.4 mg under the tongue every 5 (five) minutes as needed for chest pain (x 3 doses).    . tamsulosin (FLOMAX) 0.4 MG CAPS capsule Take 0.4 mg by mouth daily.      No current facility-administered medications for this visit.      Allergies:   Patient has no known allergies.   Social History   Social History  . Marital status: Married    Spouse name: N/A  . Number of children: 2  . Years of education: N/A   Occupational History  . Makes furniture    Social History Main Topics  . Smoking status: Never Smoker  . Smokeless tobacco: Never Used  . Alcohol use 1.2 oz/week    2 Standard drinks or equivalent per week     Comment: weekends  . Drug use: No    . Sexual activity: Not Asked   Other Topics Concern  . None   Social History Narrative   Lives at home with wife.       Family History:  Family History  Problem Relation Age of Onset  . Stroke Mother 50  . Pneumonia Mother     ROS:   Please see the history of present illness.  All other systems are reviewed and otherwise negative.    PHYSICAL EXAM:   VS:  BP 136/70   Pulse 65   Ht 5' 7.5" (1.715 m)   Wt 182 lb (82.6 kg)   BMI 28.08 kg/m   BMI: Body mass index is 28.08 kg/m. GEN: Well nourished, well developed AAM, in no acute distress  HEENT: normocephalic, atraumatic Neck: no JVD, carotid bruits, or masses Cardiac: RRR; no murmurs, rubs, or gallops, no edema  Respiratory:  clear to auscultation bilaterally, normal work of breathing GI: soft, nontender, nondistended, + BS MS: no deformity or atrophy  Skin: warm and dry, no rash Neuro:  Alert and Oriented x 3, Strength and sensation are intact, follows commands Psych: euthymic mood, full affect  Wt Readings from Last 3 Encounters:  06/27/16 182 lb (82.6 kg)  09/26/15 174 lb 1.9 oz (79 kg)  08/18/15 171 lb 8 oz (77.8 kg)      Studies/Labs Reviewed:   EKG:  EKG was ordered today and personally reviewed by me and demonstrates NSR 65bpm, early ST upsloping in V2-V4 similar to prior, no acute changes. Reviewed with Dr. Elease Hashimoto given computer interpretation of acute MI which is not felt to be the case.  Recent Labs: 08/17/2015: ALT 21 08/18/2015: Hemoglobin 12.2; Platelets 222 09/26/2015: BUN 13; Creat 1.18; Potassium 3.7; Sodium 140   Lipid Panel    Component Value Date/Time   CHOL 176 08/18/2015 0228   TRIG 96 08/18/2015 0228   HDL 35 (L) 08/18/2015 0228   CHOLHDL 5.0 08/18/2015 0228   VLDL 19 08/18/2015 0228   LDLCALC 122 (H) 08/18/2015 0228    Additional studies/ records that were reviewed today include: Summarized above.    ASSESSMENT & PLAN:   1. H/o chest pain - unusual situation as above. It's  not clear why he had a nuclear stress test to evaluate symptoms that had resolved. It sounds like furthermore the cath was then cancelled. I am unable to weigh in on this fully until we have more information. Will request records then have him return to the office to further review. In the absence of anginal symptoms, it does not seem like cath is warranted at present time but need to  have the above information to definitely decide. I also reviewed with Dr. Elease Hashimoto who feels cath is unlikely to be indicated at this time. Continue ASA. Discussed observation for progressive or worrisome sx in the meantime. 2. Essential HTN - generally controlled. Given mild orthostasis symptoms will not push for more aggressive control. We discussed orthostatic precautions. 3. Hypertensive heart disease without heart failure - continue BP control. No symptoms of CHF on exam. 4. CKD II - further monitoring per PCP. 5. Hyperlipidemia - lipids followed by primary care. Will request a copy.  Disposition: F/u with me, an APP on Dr. Jenene Slicker care team or Dr. Antoine Poche in 1 month to regroup and review records.   Medication Adjustments/Labs and Tests Ordered: Current medicines are reviewed at length with the patient today.  Concerns regarding medicines are outlined above. Medication changes, Labs and Tests ordered today are summarized above and listed in the Patient Instructions accessible in Encounters.   Thomasene Mohair PA-C  06/27/2016 8:26 AM    [facility] [address] [place], [place], [place]  [address] Phone: [phone]; Fax: [phone]

## 2016-06-27 ENCOUNTER — Encounter: Payer: Self-pay | Admitting: Physician Assistant

## 2016-06-27 ENCOUNTER — Ambulatory Visit (INDEPENDENT_AMBULATORY_CARE_PROVIDER_SITE_OTHER): Payer: Medicare HMO | Admitting: Physician Assistant

## 2016-06-27 VITALS — BP 136/70 | HR 65 | Ht 67.5 in | Wt 182.0 lb

## 2016-06-27 DIAGNOSIS — I119 Hypertensive heart disease without heart failure: Secondary | ICD-10-CM | POA: Diagnosis not present

## 2016-06-27 DIAGNOSIS — I10 Essential (primary) hypertension: Secondary | ICD-10-CM

## 2016-06-27 DIAGNOSIS — Z87898 Personal history of other specified conditions: Secondary | ICD-10-CM | POA: Diagnosis not present

## 2016-06-27 DIAGNOSIS — E785 Hyperlipidemia, unspecified: Secondary | ICD-10-CM

## 2016-06-27 DIAGNOSIS — N182 Chronic kidney disease, stage 2 (mild): Secondary | ICD-10-CM

## 2016-06-27 MED ORDER — ASPIRIN 81 MG PO TBEC: 81.0000 mg | DELAYED_RELEASE_TABLET | Freq: Every day | ORAL | 3 refills | Status: DC

## 2016-06-27 NOTE — Patient Instructions (Addendum)
Medication Instructions:  Your physician recommends that you continue on your current medications as directed. Please refer to the Current Medication list given to you today.   Labwork: None ordered  Testing/Procedures: None ordered  Follow-Up: Your physician recommends that you schedule a follow-up appointment in: 1 MONTH WITH DAYNA DUNN, PA-C, OR DR. HOCHREIN OR HIS CARE TEAM.   Any Other Special Instructions Will Be Listed Below (If Applicable).     If you need a refill on your cardiac medications before your next appointment, please call your pharmacy.

## 2016-07-03 ENCOUNTER — Telehealth: Payer: Self-pay | Admitting: Cardiology

## 2016-07-03 NOTE — Telephone Encounter (Signed)
Received records from [facility] for appointment on 08/02/16 with Dr Antoine Poche.  Records put with Dr Hochrein's schedule for 08/02/16. lp

## 2016-08-01 NOTE — Progress Notes (Signed)
Cardiology Office Note   Date:  08/02/2016   ID:  Joshua Solis, DOB 03-29-1945, MRN [id number]  PCP:  Patient, No Pcp Per  Cardiologist:   Rollene Rotunda, MD   Chief Complaint  Patient presents with  . Chest Pain      History of Present Illness: Joshua Solis is a 71 y.o. male who presents for follow-up of chest pain. He's been seen at [facility] for this. He's been seen in our clinic but this is my first visit with him. I was able to get some records and review these. He has some chest discomfort after eating something several months ago. He went to the [place] and had a stress perfusion study which demonstrated a small reversible inferior defect. The EF was well-preserved. He was referred to the [place] at [facility] to have cardiac catheterization. However, when he went to have that done he couldn't find parking so we didn't have the procedure. However, he says he's not had any chest pain since that time. He is very active working at [employer]. He does a lot of walking. With her level of activity he denies any symptoms. He can do yard work. He denies any chest pressure, neck or arm discomfort. He's had no palpitations, presyncope or syncope. He doesn't have any shortness of breath, PND or orthopnea. Of note he's had an echo which demonstrates a well preserved ejection fraction.   Past Medical History:  Diagnosis Date  . CKD (chronic kidney disease), stage II    a. Cr 1.3 during 09/2015 adm, chronicity unclear at that time; f/u value 1.18.  . Diastolic dysfunction without heart failure   . Enlarged prostate   . Essential hypertension   . Fatty liver   . Hyperlipidemia   . Hypertensive heart disease   . Hypokalemia     Past Surgical History:  Procedure Laterality Date  . HERNIA REPAIR     Inguinal      Current Outpatient Prescriptions  Medication Sig Dispense Refill  . amLODipine (NORVASC) 10 MG tablet Take 10 mg by mouth daily.    Marland Kitchen aspirin 81 MG EC tablet Take 1 tablet (81 mg total)  by mouth daily. 90 tablet 3  . atorvastatin (LIPITOR) 20 MG tablet Take 1 tablet (20 mg total) by mouth at bedtime. 20 tablet 3  . calcium carbonate (TUMS - DOSED IN MG ELEMENTAL CALCIUM) 500 MG chewable tablet Chew 1 tablet by mouth daily.    . cetirizine (ZYRTEC) 10 MG tablet Take 10 mg by mouth daily.    Marland Kitchen ibuprofen (ADVIL,MOTRIN) 200 MG tablet Take 400 mg by mouth every 6 (six) hours as needed for moderate pain.    . nitroGLYCERIN (NITROSTAT) 0.4 MG SL tablet Place 0.4 mg under the tongue every 5 (five) minutes as needed for chest pain (x 3 doses).    . tamsulosin (FLOMAX) 0.4 MG CAPS capsule Take 0.4 mg by mouth daily.      No current facility-administered medications for this visit.     Allergies:   Patient has no known allergies.    ROS:  Please see the history of present illness.   Otherwise, review of systems are positive for none.   All other systems are reviewed and negative.    PHYSICAL EXAM: VS:  BP 128/70   Pulse 68   Ht 5' 7.5" (1.715 m)   Wt 180 lb (81.6 kg)   BMI 27.78 kg/m  , BMI Body mass index is 27.78 kg/m. GENERAL:  Well appearing NECK:  No jugular venous distention, waveform within normal limits, carotid upstroke brisk and symmetric, no bruits, no thyromegaly LUNGS:  Clear to auscultation bilaterally BACK:  No CVA tenderness CHEST:  Unremarkable HEART:  PMI not displaced or sustained,S1 and S2 within normal limits, no S3, no S4, no clicks, no rubs, no murmurs ABD:  Flat, positive bowel sounds normal in frequency in pitch, no bruits, no rebound, no guarding, no midline pulsatile mass, no hepatomegaly, no splenomegaly EXT:  2 plus pulses throughout, no edema, no cyanosis no clubbing   EKG:  EKG is not ordered today.    Recent Labs: 08/17/2015: ALT 21 08/18/2015: Hemoglobin 12.2; Platelets 222 09/26/2015: BUN 13; Creat 1.18; Potassium 3.7; Sodium 140    Lipid Panel    Component Value Date/Time   CHOL 176 08/18/2015 0228   TRIG 96 08/18/2015 0228    HDL 35 (L) 08/18/2015 0228   CHOLHDL 5.0 08/18/2015 0228   VLDL 19 08/18/2015 0228   LDLCALC 122 (H) 08/18/2015 0228      Wt Readings from Last 3 Encounters:  08/02/16 180 lb (81.6 kg)  06/27/16 182 lb (82.6 kg)  09/26/15 174 lb 1.9 oz (79 kg)      Other studies Reviewed: Additional studies/ records that were reviewed today include[facility] and [facility]. Review of the above records demonstrates:  Please see elsewhere in the note.     ASSESSMENT AND PLAN:  ABNORMAL STRESS TEST:  We had a pressure is I have her LS morning [facility]'s [facility]. This was a low risk study.   Current medicines are reviewed at length with the patient today.  The patient does not have concerns regarding medicines.  The following changes have been made:  As above  Labs/ tests ordered today include:   Orders Placed This Encounter  Procedures  . Lipid panel  . Hepatic function panel     Disposition:   FU with me in six months.     Signed, Rollene RotundaJames Sumedh Shinsato, MD  08/02/2016 1:10 PM    [facility]

## 2016-08-02 ENCOUNTER — Encounter: Payer: Self-pay | Admitting: Cardiology

## 2016-08-02 ENCOUNTER — Ambulatory Visit (INDEPENDENT_AMBULATORY_CARE_PROVIDER_SITE_OTHER): Payer: Medicare HMO | Admitting: Cardiology

## 2016-08-02 VITALS — BP 128/70 | HR 68 | Ht 67.5 in | Wt 180.0 lb

## 2016-08-02 DIAGNOSIS — R9439 Abnormal result of other cardiovascular function study: Secondary | ICD-10-CM | POA: Diagnosis not present

## 2016-08-02 DIAGNOSIS — E785 Hyperlipidemia, unspecified: Secondary | ICD-10-CM | POA: Diagnosis not present

## 2016-08-02 DIAGNOSIS — I10 Essential (primary) hypertension: Secondary | ICD-10-CM

## 2016-08-02 NOTE — Patient Instructions (Signed)
Medication Instructions:  Continue current medications  Labwork: Fasting Lipids Liver  Testing/Procedures: None Ordered  Follow-Up: Your physician wants you to follow-up in: 6 Months. You will receive a reminder letter in the mail two months in advance. If you don't receive a letter, please call our office to schedule the follow-up appointment.   Any Other Special Instructions Will Be Listed Below (If Applicable).   If you need a refill on your cardiac medications before your next appointment, please call your pharmacy.

## 2017-06-30 ENCOUNTER — Other Ambulatory Visit: Payer: Self-pay | Admitting: Physician Assistant

## 2017-06-30 NOTE — Telephone Encounter (Signed)
REFILL 

## 2018-06-18 ENCOUNTER — Other Ambulatory Visit: Payer: Self-pay | Admitting: Physician Assistant

## 2018-06-18 NOTE — Telephone Encounter (Signed)
This is Dr. Hochrein's pt. °

## 2018-06-18 NOTE — Telephone Encounter (Signed)
ASA 81 mg refilled 

## 2018-09-21 NOTE — Progress Notes (Signed)
Cardiology Office Note   Date:  09/23/2018   ID:  Joshua Solis, DOB 1945-03-27, MRN [id number]  PCP:  Patient, No Pcp Per  Cardiologist:   Rollene RotundaJames Mathea Frieling, MD   Chief Complaint  Patient presents with  . LVH      History of Present Illness: Joshua Solis is a 73 y.o. male who presents forwho presents for follow-up of chest pain. He went to the [facility] and had a stress perfusion study which demonstrated a small reversible inferior defect. The EF was well-preserved. He was referred to the [facility] at [facility] to have cardiac catheterization. However, when he went to have that done he couldn't find parking so we didn't have the procedure. I saw him for the first and only time in 2018.  In 2017 he had a hypertensive response to on POET (Plain Old Exercise Treadmill) but no clear evidence of ischemia in 2017.   At that time he had moderate LVH on echo.   He says he does relatively well.  He still works.  With his physical job he denies any chest pressure, neck or arm discomfort.  He does not have any new shortness of breath, PND or orthopnea.  He does not have any palpitations, presyncope or syncope.  He does snore when he sleeps but his wife does not think that he stops breathing.  He cannot lie flat Because he has some sinus problems.  He denies weight gain or edema.  He thinks his blood pressure is usually well controlled but he is not sure his blood pressure cuff is accurate.   Past Medical History:  Diagnosis Date  . CKD (chronic kidney disease), stage II    a. Cr 1.3 during 09/2015 adm, chronicity unclear at that time; f/u value 1.18.  . Diastolic dysfunction without heart failure   . Enlarged prostate   . Essential hypertension   . Fatty liver   . Hyperlipidemia   . Hypertensive heart disease   . Hypokalemia     Past Surgical History:  Procedure Laterality Date  . HERNIA REPAIR     Inguinal      Current Outpatient Medications  Medication Sig Dispense Refill  . amLODipine (NORVASC) 10  MG tablet Take 10 mg by mouth daily.    . ASPIRIN LOW DOSE 81 MG EC tablet TAKE 1 TABLET(81 MG) BY MOUTH DAILY 90 tablet 3  . atorvastatin (LIPITOR) 20 MG tablet Take 1 tablet (20 mg total) by mouth at bedtime. 20 tablet 3  . calcium carbonate (TUMS - DOSED IN MG ELEMENTAL CALCIUM) 500 MG chewable tablet Chew 1 tablet by mouth daily.    . cetirizine (ZYRTEC) 10 MG tablet Take 10 mg by mouth daily.    Marland Kitchen. ibuprofen (ADVIL,MOTRIN) 200 MG tablet Take 400 mg by mouth every 6 (six) hours as needed for moderate pain.    . nitroGLYCERIN (NITROSTAT) 0.4 MG SL tablet Place 0.4 mg under the tongue every 5 (five) minutes as needed for chest pain (x 3 doses).    . tamsulosin (FLOMAX) 0.4 MG CAPS capsule Take 0.4 mg by mouth daily.      No current facility-administered medications for this visit.     Allergies:   Patient has no known allergies.    ROS:  Please see the history of present illness.   Otherwise, review of systems are positive for none.   All other systems are reviewed and negative.    PHYSICAL EXAM: VS:  BP 129/77   Pulse Marland Kitchen(!)  59   Temp 98.4 F (36.9 C) (Temporal)   Ht 5\' 8"  (1.727 m)   Wt 189 lb 9.6 oz (86 kg)   SpO2 98%   BMI 28.83 kg/m  , BMI Body mass index is 28.83 kg/m. GENERAL:  Well appearing NECK:  No jugular venous distention, waveform within normal limits, carotid upstroke brisk and symmetric, no bruits, no thyromegaly LUNGS:  Clear to auscultation bilaterally CHEST:  Unremarkable HEART:  PMI not displaced or sustained,S1 and S2 within normal limits, no S3, no S4, no clicks, no rubs, no murmurs ABD:  Flat, positive bowel sounds normal in frequency in pitch, no bruits, no rebound, no guarding, no midline pulsatile mass, no hepatomegaly, no splenomegaly EXT:  2 plus pulses throughout, no edema, no cyanosis no clubbing   EKG:  EKG is ordered today. The ekg ordered today demonstrates sinus rhythm, rate 59, axis within normal limits, intervals within normal limits, inferior  T wave inversions slightly more pronounced than previous possibly consistent with repolarization changes.   Recent Labs: No results found for requested labs within last 8760 hours.    Lipid Panel    Component Value Date/Time   CHOL 176 08/18/2015 0228   TRIG 96 08/18/2015 0228   HDL 35 (L) 08/18/2015 0228   CHOLHDL 5.0 08/18/2015 0228   VLDL 19 08/18/2015 0228   LDLCALC 122 (H) 08/18/2015 0228      Wt Readings from Last 3 Encounters:  09/23/18 189 lb 9.6 oz (86 kg)  08/02/16 180 lb (81.6 kg)  06/27/16 182 lb (82.6 kg)      Other studies Reviewed: Additional studies/ records that were reviewed today include: None. Review of the above records demonstrates:  Please see elsewhere in the note.     ASSESSMENT AND PLAN:  LVH:   He does need a repeat echocardiogram.  If he has consistent LVH or progressive LVH he will need PYP scanning as this would be out of portion to the level of blood pressure.  HTN: He is getting keep a blood pressure diary.  Blood pressure today is controlled but he is not sure that it is at home.  He says sometimes it is in the 952W systolic.  Further adjustments will be based on this diary.  SNORING: He has snoring, hypersomnolence during the day.  He certainly is at risk for sleep apnea and I am going order a home sleep study.  Current medicines are reviewed at length with the patient today.  The patient does not have concerns regarding medicines.  The following changes have been made:  no change  Labs/ tests ordered today include:   Orders Placed This Encounter  Procedures  . EKG 12-Lead  . ECHOCARDIOGRAM COMPLETE  . Home sleep test     Disposition:   FU with me or APP in one year.     Signed, Minus Breeding, MD  09/23/2018 1:06 PM    [facility]

## 2018-09-22 ENCOUNTER — Telehealth: Payer: Self-pay

## 2018-09-22 NOTE — Telephone Encounter (Signed)
7-22 @1120  [facility] APPT    COVID-19 Pre-Screening Questions:  . In the past 7 to 10 days have you had a cough,  shortness of breath, headache, congestion, fever (100 or greater) body aches, chills, sore throat, or sudden loss of taste or sense of smell? NO . Have you been around anyone with known Covid 19. NO . Have you been around anyone who is awaiting Covid 19 test results in the past 7 to 10 days? NO . Have you been around anyone who has been exposed to Covid 19, or has mentioned symptoms of Covid 19 within the past 7 to 10 days? NO  PT WILL ARRIVE EARLY WEARING A MASK AND NO VISITORS.

## 2018-09-23 ENCOUNTER — Encounter: Payer: Self-pay | Admitting: Cardiology

## 2018-09-23 ENCOUNTER — Encounter (INDEPENDENT_AMBULATORY_CARE_PROVIDER_SITE_OTHER): Payer: Self-pay

## 2018-09-23 ENCOUNTER — Ambulatory Visit: Payer: Medicare Other | Admitting: Cardiology

## 2018-09-23 ENCOUNTER — Other Ambulatory Visit: Payer: Self-pay

## 2018-09-23 VITALS — BP 129/77 | HR 59 | Temp 98.4°F | Ht 68.0 in | Wt 189.6 lb

## 2018-09-23 DIAGNOSIS — I517 Cardiomegaly: Secondary | ICD-10-CM

## 2018-09-23 DIAGNOSIS — R400 Somnolence: Secondary | ICD-10-CM

## 2018-09-23 DIAGNOSIS — I10 Essential (primary) hypertension: Secondary | ICD-10-CM

## 2018-09-23 NOTE — Patient Instructions (Signed)
Medication Instructions:  The current medical regimen is effective;  continue present plan and medications as directed. Please refer to the Current Medication list given to you today. If you need a refill on your cardiac medications before your next appointment, please call your pharmacy.  Testing/Procedures: Your physician has recommended that you have a home sleep study. This test records several body functions during sleep, including: brain activity, eye movement, oxygen and carbon dioxide blood levels, heart rate and rhythm, breathing rate and rhythm, the flow of air through your mouth and nose, snoring, body muscle movements, and chest and belly movement.  SOMEONE WILL BE CALLING YOU TO SET THIS TESTING UP.  Echocardiogram - Your physician has requested that you have an echocardiogram. Echocardiography is a painless test that uses sound waves to create images of your heart. It provides your doctor with information about the size and shape of your heart and how well your heart's chambers and valves are working. This procedure takes approximately one hour. There are no restrictions for this procedure. This will be performed at our [facility] location - [address].   Follow-Up: You will need a follow up appointment in 12 months-WITH APP.  Please call our office 2 months in advance, MAY 2021 to schedule this, July 2021 appointment.  You may see Minus Breeding, MD or one of the following Advanced Practice Providers on your designated Care Team: Rosaria Ferries, PA-C   Jory Sims, DNP, ANP     At [facility], you and your health needs are our priority.  As part of our continuing mission to provide you with exceptional heart care, we have created designated Provider Care Teams.  These Care Teams include your primary Cardiologist (physician) and Advanced Practice Providers (APPs -  Physician Assistants and Nurse Practitioners) who all work together to provide you with the care you  need, when you need it.  Thank you for choosing [facility] at [facility]!!

## 2018-09-29 ENCOUNTER — Telehealth: Payer: Self-pay | Admitting: *Deleted

## 2018-09-29 NOTE — Telephone Encounter (Signed)
PA submitted to [facility] via web portal for HST. Intake ID #[id number].

## 2018-09-29 NOTE — Telephone Encounter (Signed)
-----   Message from Roland Earl sent at 09/23/2018  2:11 PM EDT ----- Regarding: Home sleep study Ordered by Dr. Percival Spanish

## 2018-10-01 ENCOUNTER — Other Ambulatory Visit: Payer: Self-pay

## 2018-10-01 ENCOUNTER — Ambulatory Visit (HOSPITAL_COMMUNITY): Payer: Medicare Other | Attending: Cardiovascular Disease

## 2018-10-01 DIAGNOSIS — I517 Cardiomegaly: Secondary | ICD-10-CM | POA: Diagnosis not present

## 2018-11-05 ENCOUNTER — Telehealth: Payer: Self-pay | Admitting: *Deleted

## 2018-11-05 NOTE — Telephone Encounter (Signed)
The patient called in stating that he needs a letter for the [place] that states his current cardiac condition. He is trying to get help due to Agent Orange and  PTSD. He wanted to know if Dr. Percival Spanish would be willing to write this letter.

## 2018-11-10 NOTE — Telephone Encounter (Signed)
He has HTN and LVH likely related to this that has been stable.  We can put this in a letter but I don't think that it will help him to make his case.

## 2018-11-12 NOTE — Telephone Encounter (Signed)
Left message to call back  

## 2018-11-18 NOTE — Telephone Encounter (Signed)
Spoke to pt about wanting to send a letter to file a claim for his PTSD and Agent Orange. Told the pt Dr. Rosezella Florida advise and pt still wanted letter. Printed letter and sent it to [name], LPN to be stamped and sent in the mail.

## 2019-03-26 ENCOUNTER — Encounter: Payer: Self-pay | Admitting: *Deleted

## 2019-07-24 ENCOUNTER — Emergency Department (HOSPITAL_BASED_OUTPATIENT_CLINIC_OR_DEPARTMENT_OTHER)
Admission: EM | Admit: 2019-07-24 | Discharge: 2019-07-24 | Disposition: A | Discharge status: Home / Self Care | Payer: Medicare Other | Attending: Emergency Medicine | Admitting: Emergency Medicine

## 2019-07-24 ENCOUNTER — Other Ambulatory Visit: Payer: Self-pay

## 2019-07-24 ENCOUNTER — Encounter (HOSPITAL_BASED_OUTPATIENT_CLINIC_OR_DEPARTMENT_OTHER): Payer: Self-pay | Admitting: Emergency Medicine

## 2019-07-24 ENCOUNTER — Emergency Department (HOSPITAL_BASED_OUTPATIENT_CLINIC_OR_DEPARTMENT_OTHER): Payer: Medicare Other

## 2019-07-24 DIAGNOSIS — S8991XA Unspecified injury of right lower leg, initial encounter: Secondary | ICD-10-CM | POA: Diagnosis present

## 2019-07-24 DIAGNOSIS — Y999 Unspecified external cause status: Secondary | ICD-10-CM | POA: Diagnosis not present

## 2019-07-24 DIAGNOSIS — Z7982 Long term (current) use of aspirin: Secondary | ICD-10-CM | POA: Diagnosis not present

## 2019-07-24 DIAGNOSIS — N182 Chronic kidney disease, stage 2 (mild): Secondary | ICD-10-CM | POA: Insufficient documentation

## 2019-07-24 DIAGNOSIS — Y9289 Other specified places as the place of occurrence of the external cause: Secondary | ICD-10-CM | POA: Diagnosis not present

## 2019-07-24 DIAGNOSIS — Z79899 Other long term (current) drug therapy: Secondary | ICD-10-CM | POA: Insufficient documentation

## 2019-07-24 DIAGNOSIS — Y9389 Activity, other specified: Secondary | ICD-10-CM | POA: Insufficient documentation

## 2019-07-24 DIAGNOSIS — X509XXA Other and unspecified overexertion or strenuous movements or postures, initial encounter: Secondary | ICD-10-CM | POA: Diagnosis not present

## 2019-07-24 DIAGNOSIS — I129 Hypertensive chronic kidney disease with stage 1 through stage 4 chronic kidney disease, or unspecified chronic kidney disease: Secondary | ICD-10-CM | POA: Diagnosis not present

## 2019-07-24 NOTE — ED Provider Notes (Addendum)
[facility] EMERGENCY DEPARTMENT Provider Note   CSN: [id number] Arrival date & time: 07/24/19  1356     History Chief Complaint  Patient presents with  . Knee Injury    Joshua Solis is a 74 y.o. male presenting to the emergency department with sudden onset of right knee pain and swelling that began a week and a half ago at work.  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  He thinks he may have twisted his knee.  He has pain in the anterior aspect and the right posterior aspect that is worse with movement.  He is able to ambulate without difficulty though have to take it easy.  He states the swelling has been gradually improving and he has been treating his symptoms with Aleve.  No previous injuries to the knee.  The history is provided by the patient.       Past Medical History:  Diagnosis Date  . CKD (chronic kidney disease), stage II    a. Cr 1.3 during 09/2015 adm, chronicity unclear at that time; f/u value 1.18.  . Diastolic dysfunction without heart failure   . Enlarged prostate   . Essential hypertension   . Fatty liver   . Hyperlipidemia   . Hypertensive heart disease   . Hypokalemia     Patient Active Problem List   Diagnosis Date Noted  . Abnormal stress test 08/02/2016  . Hyperlipidemia 08/02/2016  . Chest pain 08/17/2015  . Essential hypertension 08/17/2015  . Renal insufficiency 08/17/2015  . Hypokalemia 08/17/2015    Past Surgical History:  Procedure Laterality Date  . HERNIA REPAIR     Inguinal        Family History  Problem Relation Age of Onset  . Stroke Mother 69  . Pneumonia Mother     Social History   Tobacco Use  . Smoking status: Never Smoker  . Smokeless tobacco: Never Used  Substance Use Topics  . Alcohol use: Yes    Alcohol/week: 2.0 standard drinks    Types: 2 Standard drinks or equivalent per week    Comment: weekends  . Drug use: No    Home Medications Prior to Admission  medications   Medication Sig Start Date End Date Taking? Authorizing Provider  amLODipine (NORVASC) 10 MG tablet Take 10 mg by mouth daily.    [provider]  ASPIRIN LOW DOSE 81 MG EC tablet TAKE 1 TABLET(81 MG) BY MOUTH DAILY 06/18/18   Rollene Rotunda, MD  atorvastatin (LIPITOR) 20 MG tablet Take 1 tablet (20 mg total) by mouth at bedtime. 08/18/15   Rai, Ripudeep K, MD  calcium carbonate (TUMS - DOSED IN MG ELEMENTAL CALCIUM) 500 MG chewable tablet Chew 1 tablet by mouth daily.    [provider]  cetirizine (ZYRTEC) 10 MG tablet Take 10 mg by mouth daily.    [provider]  ibuprofen (ADVIL,MOTRIN) 200 MG tablet Take 400 mg by mouth every 6 (six) hours as needed for moderate pain.    [provider]  nitroGLYCERIN (NITROSTAT) 0.4 MG SL tablet Place 0.4 mg under the tongue every 5 (five) minutes as needed for chest pain (x 3 doses).    [provider]  tamsulosin (FLOMAX) 0.4 MG CAPS capsule Take 0.4 mg by mouth daily.     [provider]    Allergies    Patient has no known allergies.  Review of Systems   Review of Systems  Musculoskeletal:  Positive for arthralgias and joint swelling.  Skin: Negative for wound.    Physical Exam Updated Vital Signs BP (!) 156/76 (BP Location: Right Arm)   Pulse 69   Temp 98.3 F (36.8 C) (Oral)   Resp 16   Ht 5\' 7"  (1.702 m)   Wt 78 kg   SpO2 97%   BMI 26.94 kg/m   Physical Exam Vitals and nursing note reviewed.  Constitutional:      General: He is not in acute distress.    Appearance: He is well-developed.  HENT:     Head: Normocephalic and atraumatic.  Eyes:     Conjunctiva/sclera: Conjunctivae normal.  Cardiovascular:     Rate and Rhythm: Normal rate.  Pulmonary:     Effort: Pulmonary effort is normal.  Musculoskeletal:     Comments: Right knee with generalized swelling.  Some mild tenderness to the anterior aspect and posterior aspect.  No redness or warmth.  Patient is able  to range the knee without difficulty. Normal gait.   Neurological:     Mental Status: He is alert.  Psychiatric:        Mood and Affect: Mood normal.        Behavior: Behavior normal.     ED Results / Procedures / Treatments   Labs (all labs ordered are listed, but only abnormal results are displayed) Labs Reviewed - No data to display  EKG None  Radiology DG Knee Complete 4 Views Right  Result Date: 07/24/2019 CLINICAL DATA:  Pain after injury. EXAM: RIGHT KNEE - COMPLETE 4+ VIEW COMPARISON:  None. FINDINGS: Vascular calcifications. No fractures are noted. No acute abnormalities. IMPRESSION: No acute abnormalities.  No fractures. Electronically Signed   By: Dorise Bullion III M.D   On: 07/24/2019 14:31    Procedures Procedures (including critical care time)  Medications Ordered in ED Medications - No data to display  ED Course  I have reviewed the triage vital signs and the nursing notes.  Pertinent labs & imaging results that were available during my care of the patient were reviewed by me and considered in my medical decision making (see chart for details).    MDM Rules/Calculators/A&P                      Patient with right knee injury that occurred a week and a half ago with some persistent swelling and pain.  He is however able to ambulate.  X-rays negative for acute abnormalities.  Patient will be treated with Akkerman therapy, OTC medication, and provided referral to sports medicine for further management.  Hinged knee brace applied, however patient declined crutches.  Encouraged follow-up due to persistent swelling pain.  Encouraged to elevate as much as possible to help with swelling and ice 20 minutes at a time.  Safe for discharge.  Discussed results, findings, treatment and follow up. Patient advised of return precautions. Patient verbalized understanding and agreed with plan.  Final Clinical Impression(s) / ED Diagnoses Final diagnoses:  Injury of right knee,  initial encounter    Rx / DC Orders ED Discharge Orders    None       Sharmaine Bain, [place] N, PA-C 07/24/19 1540    Kashay Cavenaugh, [place] N, [place] 07/24/19 1540    Isla Pence, MD 07/24/19 1600

## 2019-07-24 NOTE — ED Notes (Signed)
Doesn't need crutches-per pt.

## 2019-07-24 NOTE — ED Triage Notes (Signed)
R knee injury since Wednesday. He thinks he injured it at work when he was jumping to compress the cardboard in the recycle bin.

## 2019-07-24 NOTE — Discharge Instructions (Addendum)
Please read instructions below. Apply ice to your knee for 20 minutes at a time. Elevate it as much as possible to help with swelling. You can take over-the-counter medications as directed for pain. Schedule an appointment with Dr. Norleen Xie Likes for further management of your injury. Return to the ER for new or concerning symptoms.

## 2019-07-29 NOTE — Progress Notes (Signed)
Joshua Solis - 74 y.o. male MRN [id number]  Date of birth: 15-Nov-1945  SUBJECTIVE:  Including CC & ROS.  Chief Complaint  Patient presents with  . Knee Injury    right x 07/21/2019    Joshua Solis is a 74 y.o. male that is presenting with right knee pain.  The pain started when he was at work trying to push down some cardboard boxes.  Since that time he has had swelling and anterior knee pain.  He has had gotten improvement with the naproxen.  No prior injury or surgery.  Seems to be worse at the end of the day.  No mechanical symptoms.  Does have some pain that radiates down the lateral aspect of the lower leg..  Independent review of the right knee x-ray from 5/22 shows mild medial joint space narrowing.   Review of Systems See HPI   HISTORY: Past Medical, Surgical, Social, and Family History Reviewed & Updated per EMR.   Pertinent Historical Findings include:  Past Medical History:  Diagnosis Date  . CKD (chronic kidney disease), stage II    a. Cr 1.3 during 09/2015 adm, chronicity unclear at that time; f/u value 1.18.  . Diastolic dysfunction without heart failure   . Enlarged prostate   . Essential hypertension   . Fatty liver   . Hyperlipidemia   . Hypertensive heart disease   . Hypokalemia     Past Surgical History:  Procedure Laterality Date  . HERNIA REPAIR     Inguinal     Family History  Problem Relation Age of Onset  . Stroke Mother 72  . Pneumonia Mother     Social History   Socioeconomic History  . Marital status: Married    Spouse name: Not on file  . Number of children: 2  . Years of education: Not on file  . Highest education level: Not on file  Occupational History  . Occupation: Theatre manager  Tobacco Use  . Smoking status: Never Smoker  . Smokeless tobacco: Never Used  Substance and Sexual Activity  . Alcohol use: Yes    Alcohol/week: 2.0 standard drinks    Types: 2 Standard drinks or equivalent per week    Comment: weekends  . Drug  use: No  . Sexual activity: Not on file  Other Topics Concern  . Not on file  Social History Narrative   Lives at home with wife.     Social Determinants of Health   Financial Resource Strain:   . Difficulty of Paying Living Expenses:   Food Insecurity:   . Worried About Charity fundraiser in the Last Year:   . Arboriculturist in the Last Year:   Transportation Needs:   . Film/video editor (Medical):   Marland Kitchen Lack of Transportation (Non-Medical):   Physical Activity:   . Days of Exercise per Week:   . Minutes of Exercise per Session:   Stress:   . Feeling of Stress :   Social Connections:   . Frequency of Communication with Friends and Family:   . Frequency of Social Gatherings with Friends and Family:   . Attends Religious Services:   . Active Member of Clubs or Organizations:   . Attends Archivist Meetings:   Marland Kitchen Marital Status:   Intimate Partner Violence:   . Fear of Current or Ex-Partner:   . Emotionally Abused:   Marland Kitchen Physically Abused:   . Sexually Abused:      PHYSICAL EXAM:  VS: BP 137/80   Pulse 76   Ht 5\' 8"  (1.727 m)   Wt 172 lb (78 kg)   BMI 26.15 kg/m  Physical Exam Gen: NAD, alert, cooperative with exam, well-appearing MSK:  Right knee: Effusion noted. Normal range of motion. No instability. No tenderness to palpation of the medial lateral joint line. Negative Thessaly test. Neurovascularly intact  Limited ultrasound: Right knee:  Moderate effusion within the suprapatellar pouch. Normal-appearing quadricep and patellar tendon. Normal medial joint space. Normal lateral joint space. Some increased hyperemia over the lateral compartment but no pain in this area  Summary: Effusion noted on exam  Ultrasound and interpretation by , MD    ASSESSMENT & PLAN:   Acute pain of right knee Injury occurred while at work.  No significant degenerative changes.  Possible for meniscal irritation but clinical exam is reassuring.    -Counseled on home exercise therapy and supportive care. -Naproxen. -Counseled on compression. -Provided work note. -If no improvement can consider injection and physical therapy.

## 2019-07-30 ENCOUNTER — Other Ambulatory Visit: Payer: Self-pay

## 2019-07-30 ENCOUNTER — Encounter: Payer: Self-pay | Admitting: Family Medicine

## 2019-07-30 ENCOUNTER — Ambulatory Visit: Payer: Self-pay

## 2019-07-30 ENCOUNTER — Ambulatory Visit: Payer: Medicare Other | Admitting: Family Medicine

## 2019-07-30 VITALS — BP 137/80 | HR 76 | Ht 68.0 in | Wt 172.0 lb

## 2019-07-30 DIAGNOSIS — M25561 Pain in right knee: Secondary | ICD-10-CM

## 2019-07-30 DIAGNOSIS — S83206D Unspecified tear of unspecified meniscus, current injury, right knee, subsequent encounter: Secondary | ICD-10-CM | POA: Insufficient documentation

## 2019-07-30 MED ORDER — NAPROXEN 500 MG PO TABS: 500.0000 mg | ORAL_TABLET | Freq: Two times a day (BID) | ORAL | 0 refills | Status: AC | PRN

## 2019-07-30 NOTE — Patient Instructions (Signed)
Nice to meet you Please try ice  Please try compression  Please try the exercises  Please take the naproxen for 4 days straight and then as needed  Please send me a message in MyChart with any questions or updates.  Please see me back in 4 weeks.   --Dr. Jordan Likes

## 2019-07-30 NOTE — Assessment & Plan Note (Signed)
Injury occurred while at work.  No significant degenerative changes.  Possible for meniscal irritation but clinical exam is reassuring.   -Counseled on home exercise therapy and supportive care. -Naproxen. -Counseled on compression. -Provided work note. -If no improvement can consider injection and physical therapy.

## 2019-08-09 ENCOUNTER — Ambulatory Visit: Payer: Medicare Other | Admitting: Adult Health

## 2019-08-25 NOTE — Progress Notes (Signed)
Cardiology Office Note   Date:  08/26/2019   ID:  Joshua Solis, DOB 11/22/1945, MRN [id number]  PCP:  Patient, No Pcp Per  Cardiologist:  Dr. Antoine Poche No chief complaint on file.    History of Present Illness: Joshua Solis is a 74 y.o. male who presents for ongoing assessment and management of chest pain, with hypertensive response to POET.He was to have cardiac cath in [place] but did not stay for procedure due to parking issues.  He was last seen by Dr. Antoine Poche on 09/23/2018 and was without complaints. 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 He was scheduled for a repeat echocardiogram. He was also ordered a home sleep study due to hypertension and heavy snoring.   Echo on 10/01/2018 revealed normal LV function, with moderately increased left ventricular wall thickness. It does not appear that he had his sleep study competed.   He reports approximately 1 week ago after eating some ribs he had severe heartburn, with associated nausea vomiting and diarrhea.  This lasted for 3 days.  He denied any radiation of the pain, he denied any shortness of breath or dizziness associated with this.  He denied any rapid heart rhythm.  He states this is happened to him before after eating greasy food, and also having an episode of food poisoning.  He is feeling much better now and is eating normal foods without GI issues.  He has been medically compliant, he has not yet had his sleep study which I will discussed with him today.  Past Medical History:  Diagnosis Date   CKD (chronic kidney disease), stage II    a. Cr 1.3 during 09/2015 adm, chronicity unclear at that time; f/u value 1.18.   Diastolic dysfunction without heart failure    Enlarged prostate    Essential hypertension    Fatty liver    Hyperlipidemia    Hypertensive heart disease    Hypokalemia     Past Surgical History:  Procedure Laterality Date   HERNIA REPAIR      Inguinal      Current Outpatient Medications  Medication Sig Dispense Refill   amLODipine (NORVASC) 10 MG tablet Take 10 mg by mouth daily.     ASPIRIN LOW DOSE 81 MG EC tablet TAKE 1 TABLET(81 MG) BY MOUTH DAILY 90 tablet 3   atorvastatin (LIPITOR) 20 MG tablet Take 1 tablet (20 mg total) by mouth at bedtime. 20 tablet 3   calcium carbonate (TUMS - DOSED IN MG ELEMENTAL CALCIUM) 500 MG chewable tablet Chew 1 tablet by mouth daily.     cetirizine (ZYRTEC) 10 MG tablet Take 10 mg by mouth daily.     ibuprofen (ADVIL,MOTRIN) 200 MG tablet Take 400 mg by mouth every 6 (six) hours as needed for moderate pain.     naproxen (NAPROSYN) 500 MG tablet Take 1 tablet (500 mg total) by mouth 2 (two) times daily as needed. 60 tablet 0   nitroGLYCERIN (NITROSTAT) 0.4 MG SL tablet Place 0.4 mg under the tongue every 5 (five) minutes as needed for chest pain (x 3 doses).     tamsulosin (FLOMAX) 0.4 MG CAPS capsule Take 0.4 mg by mouth daily.      No current facility-administered medications for this visit.    Allergies:   Patient has no known allergies.    Social History:  The patient  reports that he has never smoked. He has never used smokeless tobacco. He  reports current alcohol use of about 2.0 standard drinks of alcohol per week. He reports that he does not use drugs.   Family History:  The patient's family history includes Pneumonia in his mother; Stroke (age of onset: 48) in his mother.    ROS: All other systems are reviewed and negative. Unless otherwise mentioned in H&P    PHYSICAL EXAM: VS:  BP (!) 146/74    Pulse 63    Ht 5\' 8"  (1.727 m)    Wt 186 lb 3.2 oz (84.5 kg)    BMI 28.31 kg/m  , BMI Body mass index is 28.31 kg/m. GEN: Well nourished, well developed, in no acute distress HEENT: normal Neck: no JVD, carotid bruits, or masses Cardiac: RRR; 1/6 murmurs, rubs, or gallops,no edema  Respiratory:  Clear to auscultation bilaterally, normal work of breathing GI: soft, right  upper quadrant tenderness, positive Murphy sign, nondistended, + BS MS: no deformity or atrophy Skin: warm and dry, no rash Neuro:  Strength and sensation are intact Psych: euthymic mood, full affect   EKG: Normal sinus rhythm with sinus arrhythmia, LVH, questionable septal infarct.  Heart rate of 63 bpm.  (Personally reviewed unchanged from prior EKG 1 year ago).  Recent Labs: No results found for requested labs within last 8760 hours.    Lipid Panel    Component Value Date/Time   CHOL 176 08/18/2015 0228   TRIG 96 08/18/2015 0228   HDL 35 (L) 08/18/2015 0228   CHOLHDL 5.0 08/18/2015 0228   VLDL 19 08/18/2015 0228   LDLCALC 122 (H) 08/18/2015 0228      Wt Readings from Last 3 Encounters:  08/26/19 186 lb 3.2 oz (84.5 kg)  07/30/19 172 lb (78 kg)  07/24/19 172 lb (78 kg)      Other studies Reviewed: Echocardiogram 10-18-2018 1. The left ventricle has normal systolic function, with an ejection  fraction of 55-60%. The cavity size was normal. There is moderately  increased left ventricular wall thickness. Left ventricular diastolic  parameters were normal.  2. The right ventricle has normal systolic function. The cavity was  normal. There is no increase in right ventricular wall thickness.  3. Left atrial size was mildly dilated.  4. Small pericardial effusion.  5. The pericardial effusion is posterior to the left ventricle.  6. Mild thickening of the mitral valve leaflet. Mild calcification of the  mitral valve leaflet.  7. The aortic valve was not well visualized. Moderate thickening of the  aortic valve. Sclerosis without any evidence of stenosis of the aortic  valve.  8. The aorta is normal in size and structure.    ASSESSMENT AND PLAN:  1.  Severe heartburn X1 : Occurring after eating barbecue ribs with associated nausea vomiting and diarrhea for 3 days.  EKG is unchanged.  He does have a positive Murphy sign with right upper quadrant tenderness.  He  will be scheduled for gallbladder ultrasound.  If positive will need to follow-up with PCP for further recommendations.  He sees Dr. Posey Pronto at the [facility] clinic in [place], [place].  We will check CMET.  2.  Hypertension: Blood pressure slightly elevated today.  We will continue amlodipine 10 mg daily.  Would recommend decreasing use of Naprosyn or any other NSAIDs.  He will be rescheduled for OSA evaluation.  3.  Hyperlipidemia: Continue atorvastatin as directed.  Checking lipids today.  Current medicines are reviewed at length with the patient today.  I have spent 30 minutes dedicated to the  care of this patient on the date of this encounter to include pre-visit review of records, assessment, management and diagnostic testing,with shared decision making.  Labs/ tests ordered today include: CMET, Lipids, CBC. Sleep Study, Abdominal Ultrasound  Bettey Mare. Liborio Nixon, ANP, [facility]   08/26/2019 9:49 AM    [facility] [address] Office [phone] Fax [phone]  Notice: This dictation was prepared with Dragon dictation along with smaller phrase technology. Any transcriptional errors that result from this process are unintentional and may not be corrected upon review.

## 2019-08-26 ENCOUNTER — Ambulatory Visit: Payer: Medicare Other | Admitting: Adult Health

## 2019-08-26 ENCOUNTER — Other Ambulatory Visit: Payer: Self-pay

## 2019-08-26 ENCOUNTER — Encounter: Payer: Self-pay | Admitting: Adult Health

## 2019-08-26 VITALS — BP 146/74 | HR 63 | Ht 68.0 in | Wt 186.2 lb

## 2019-08-26 DIAGNOSIS — Z79899 Other long term (current) drug therapy: Secondary | ICD-10-CM | POA: Diagnosis not present

## 2019-08-26 DIAGNOSIS — I10 Essential (primary) hypertension: Secondary | ICD-10-CM

## 2019-08-26 DIAGNOSIS — R1011 Right upper quadrant pain: Secondary | ICD-10-CM | POA: Diagnosis not present

## 2019-08-26 DIAGNOSIS — E785 Hyperlipidemia, unspecified: Secondary | ICD-10-CM

## 2019-08-26 DIAGNOSIS — G4733 Obstructive sleep apnea (adult) (pediatric): Secondary | ICD-10-CM

## 2019-08-26 LAB — COMPREHENSIVE METABOLIC PANEL
ALT: 18 IU/L (ref 0–44)
AST: 17 IU/L (ref 0–40)
Albumin/Globulin Ratio: 1.4 (ref 1.2–2.2)
Albumin: 4.2 g/dL (ref 3.7–4.7)
Alkaline Phosphatase: 66 IU/L (ref 48–121)
BUN/Creatinine Ratio: 11 (ref 10–24)
BUN: 14 mg/dL (ref 8–27)
Bilirubin Total: 0.8 mg/dL (ref 0.0–1.2)
CO2: 19 mmol/L — ABNORMAL LOW (ref 20–29)
Calcium: 9.2 mg/dL (ref 8.6–10.2)
Chloride: 104 mmol/L (ref 96–106)
Creatinine, Ser: 1.28 mg/dL — ABNORMAL HIGH (ref 0.76–1.27)
GFR calc Af Amer: 64 mL/min/{1.73_m2} (ref >59)
GFR calc non Af Amer: 55 mL/min/{1.73_m2} — ABNORMAL LOW (ref >59)
Globulin, Total: 3 g/dL (ref 1.5–4.5)
Glucose: 115 mg/dL — ABNORMAL HIGH (ref 65–99)
Potassium: 4.4 mmol/L (ref 3.5–5.2)
Sodium: 139 mmol/L (ref 134–144)
Total Protein: 7.2 g/dL (ref 6.0–8.5)

## 2019-08-26 LAB — LIPID PANEL
Chol/HDL Ratio: 5.6 ratio — ABNORMAL HIGH (ref 0.0–5.0)
Cholesterol, Total: 189 mg/dL (ref 100–199)
HDL: 34 mg/dL — ABNORMAL LOW (ref >39)
LDL Chol Calc (NIH): 125 mg/dL — ABNORMAL HIGH (ref 0–99)
Triglycerides: 167 mg/dL — ABNORMAL HIGH (ref 0–149)
VLDL Cholesterol Cal: 30 mg/dL (ref 5–40)

## 2019-08-26 LAB — CBC
Hematocrit: 41.2 % (ref 37.5–51.0)
Hemoglobin: 14.3 g/dL (ref 13.0–17.7)
MCH: 32.2 pg (ref 26.6–33.0)
MCHC: 34.7 g/dL (ref 31.5–35.7)
MCV: 93 fL (ref 79–97)
Platelets: 292 10*3/uL (ref 150–450)
RBC: 4.44 x10E6/uL (ref 4.14–5.80)
RDW: 13.5 % (ref 11.6–15.4)
WBC: 4.9 10*3/uL (ref 3.4–10.8)

## 2019-08-26 NOTE — Patient Instructions (Signed)
Medication Instructions:  Continue current medications  *If you need a refill on your cardiac medications before your next appointment, please call your pharmacy*   Lab Work: Fasting Lipid, CBC, CMP  If you have labs (blood work) drawn today and your tests are completely normal, you will receive your results only by:  MyChart Message (if you have MyChart) OR  A paper copy in the mail If you have any lab test that is abnormal or we need to change your treatment, we will call you to review the results.   Testing/Procedures: Your physician recommended you get a Gall Bladder Ultrasound. This will be done at the [facility] on [address]   Follow-Up: At [facility], you and your health needs are our priority.  As part of our continuing mission to provide you with exceptional heart care, we have created designated Provider Care Teams.  These Care Teams include your primary Cardiologist (physician) and Advanced Practice Providers (APPs -  Physician Assistants and Nurse Practitioners) who all work together to provide you with the care you need, when you need it.  We recommend signing up for the patient portal called "MyChart".  Sign up information is provided on this After Visit Summary.  MyChart is used to connect with patients for Virtual Visits (Telemedicine).  Patients are able to view lab/test results, encounter notes, upcoming appointments, etc.  Non-urgent messages can be sent to your provider as well.   To learn more about what you can do with MyChart, go to [email or web address].    Your next appointment:   6 month(s)  The format for your next appointment:   In Person  Provider:   You may see Rollene Rotunda, MD or one of the following Advanced Practice Providers on your designated Care Team:    Theodore Demark, PA-C  Joni Reining, DNP, ANP  Cadence Fransico Michael, NP

## 2019-08-27 ENCOUNTER — Ambulatory Visit: Payer: Self-pay

## 2019-08-27 ENCOUNTER — Ambulatory Visit (INDEPENDENT_AMBULATORY_CARE_PROVIDER_SITE_OTHER): Payer: Self-pay | Admitting: Family Medicine

## 2019-08-27 VITALS — BP 140/70 | Ht 68.0 in | Wt 186.0 lb

## 2019-08-27 DIAGNOSIS — M25461 Effusion, right knee: Secondary | ICD-10-CM

## 2019-08-27 MED ORDER — TRIAMCINOLONE ACETONIDE 40 MG/ML IJ SUSP
40.0000 mg | Freq: Once | INTRAMUSCULAR | Status: AC
  Administered 2019-08-27: 40 mg via INTRA_ARTICULAR

## 2019-08-27 NOTE — Patient Instructions (Signed)
Good to see you Please use ice as needed  Please try the brace at work   Please send me a message in MyChart with any questions or updates.  Please see me back in 4 weeks.   --Dr. Jordan Likes

## 2019-08-27 NOTE — Assessment & Plan Note (Signed)
Acute worsening of his pain.  Had a large effusion on exam.  Aspiration was normal appearance. -Counseled on home exercise therapy and supportive care. -Aspiration and injection. -Hinged knee brace. -Provided work note. -Could consider physical therapy or further imaging if needed.

## 2019-08-27 NOTE — Progress Notes (Signed)
Joshua Solis - 74 y.o. male MRN [id number]  Date of birth: [birth year]  SUBJECTIVE:  Including CC & ROS.  No chief complaint on file.   Joshua Solis is a 74 y.o. male that is presenting with worsening of his right knee pain.  It seems to swell up from time to time.  He denies any mechanical symptoms.  The pain is over the medial lateral aspect.  Has been using naproxen which did improve his pain initially.   Review of Systems See HPI   HISTORY: Past Medical, Surgical, Social, and Family History Reviewed & Updated per EMR.   Pertinent Historical Findings include:  Past Medical History:  Diagnosis Date  . CKD (chronic kidney disease), stage II    a. Cr 1.3 during 09/2015 adm, chronicity unclear at that time; f/u value 1.18.  . Diastolic dysfunction without heart failure   . Enlarged prostate   . Essential hypertension   . Fatty liver   . Hyperlipidemia   . Hypertensive heart disease   . Hypokalemia     Past Surgical History:  Procedure Laterality Date  . HERNIA REPAIR     Inguinal     Family History  Problem Relation Age of Onset  . Stroke Mother 32  . Pneumonia Mother     Social History   Socioeconomic History  . Marital status: Married    Spouse name: Not on file  . Number of children: 2  . Years of education: Not on file  . Highest education level: Not on file  Occupational History  . Occupation: Theatre manager  Tobacco Use  . Smoking status: Never Smoker  . Smokeless tobacco: Never Used  Substance and Sexual Activity  . Alcohol use: Yes    Alcohol/week: 2.0 standard drinks    Types: 2 Standard drinks or equivalent per week    Comment: weekends  . Drug use: No  . Sexual activity: Not on file  Other Topics Concern  . Not on file  Social History Narrative   Lives at home with wife.     Social Determinants of Health   Financial Resource Strain:   . Difficulty of Paying Living Expenses:   Food Insecurity:   . Worried About Charity fundraiser in the  Last Year:   . Arboriculturist in the Last Year:   Transportation Needs:   . Film/video editor (Medical):   Marland Kitchen Lack of Transportation (Non-Medical):   Physical Activity:   . Days of Exercise per Week:   . Minutes of Exercise per Session:   Stress:   . Feeling of Stress :   Social Connections:   . Frequency of Communication with Friends and Family:   . Frequency of Social Gatherings with Friends and Family:   . Attends Religious Services:   . Active Member of Clubs or Organizations:   . Attends Archivist Meetings:   Marland Kitchen Marital Status:   Intimate Partner Violence:   . Fear of Current or Ex-Partner:   . Emotionally Abused:   Marland Kitchen Physically Abused:   . Sexually Abused:      PHYSICAL EXAM:  VS: BP 140/70   Ht 5\' 8"  (1.727 m)   Wt 186 lb (84.4 kg)   BMI 28.28 kg/m  Physical Exam Gen: NAD, alert, cooperative with exam, well-appearing MSK:  Right knee: Moderate effusion. No instability valgus or varus stress testing. Normal strength resistance. Neurovascularly intact   Aspiration/Injection Procedure Note Joshua Solis [birth year]  Procedure: Aspiration and  Injection Indications: Right knee pain  Procedure Details Consent: Risks of procedure as well as the alternatives and risks of each were explained to the (patient/caregiver).  Consent for procedure obtained. Time Out: Verified patient identification, verified procedure, site/side was marked, verified correct patient position, special equipment/implants available, medications/allergies/relevent history reviewed, required imaging and test results available.  Performed.  The area was cleaned with iodine and alcohol swabs.    The right knee superior lateral suprapatellar pouch was injected using 3 cc of 1% lidocaine on a 25-gauge 1-1/2 inch needle.  An 18-gauge 1 1/2" needle was used to achieve aspiration.  The syringe was switched and extra containing 1 cc's of 40 mg Kenalog and 4 cc's of 0.25% bupivacaine was  injected.  Ultrasound was used. Images were obtained in long views showing the injection.    Amount of Fluid Aspirated: 67mL Character of Fluid: clear and straw colored Fluid was sent for:n/a  A sterile dressing was applied.  Patient did tolerate procedure well.     ASSESSMENT & PLAN:   Knee effusion, right Acute worsening of his pain.  Had a large effusion on exam.  Aspiration was normal appearance. -Counseled on home exercise therapy and supportive care. -Aspiration and injection. -Hinged knee brace. -Provided work note. -Could consider physical therapy or further imaging if needed.

## 2019-08-30 ENCOUNTER — Emergency Department (HOSPITAL_BASED_OUTPATIENT_CLINIC_OR_DEPARTMENT_OTHER)
Admission: EM | Admit: 2019-08-30 | Discharge: 2019-08-30 | Disposition: A | Discharge status: Home / Self Care | Payer: Medicare Other | Attending: Emergency Medicine | Admitting: Emergency Medicine

## 2019-08-30 ENCOUNTER — Emergency Department (HOSPITAL_BASED_OUTPATIENT_CLINIC_OR_DEPARTMENT_OTHER): Payer: Medicare Other

## 2019-08-30 ENCOUNTER — Encounter (HOSPITAL_BASED_OUTPATIENT_CLINIC_OR_DEPARTMENT_OTHER): Payer: Self-pay | Admitting: *Deleted

## 2019-08-30 ENCOUNTER — Other Ambulatory Visit: Payer: Self-pay

## 2019-08-30 DIAGNOSIS — I129 Hypertensive chronic kidney disease with stage 1 through stage 4 chronic kidney disease, or unspecified chronic kidney disease: Secondary | ICD-10-CM | POA: Diagnosis not present

## 2019-08-30 DIAGNOSIS — R1011 Right upper quadrant pain: Secondary | ICD-10-CM | POA: Diagnosis not present

## 2019-08-30 DIAGNOSIS — R110 Nausea: Secondary | ICD-10-CM | POA: Insufficient documentation

## 2019-08-30 DIAGNOSIS — R748 Abnormal levels of other serum enzymes: Secondary | ICD-10-CM | POA: Diagnosis not present

## 2019-08-30 DIAGNOSIS — R072 Precordial pain: Secondary | ICD-10-CM | POA: Diagnosis present

## 2019-08-30 DIAGNOSIS — Z7982 Long term (current) use of aspirin: Secondary | ICD-10-CM | POA: Insufficient documentation

## 2019-08-30 DIAGNOSIS — R1013 Epigastric pain: Secondary | ICD-10-CM | POA: Insufficient documentation

## 2019-08-30 DIAGNOSIS — N182 Chronic kidney disease, stage 2 (mild): Secondary | ICD-10-CM | POA: Diagnosis not present

## 2019-08-30 DIAGNOSIS — K859 Acute pancreatitis without necrosis or infection, unspecified: Secondary | ICD-10-CM

## 2019-08-30 DIAGNOSIS — R079 Chest pain, unspecified: Secondary | ICD-10-CM

## 2019-08-30 LAB — CBC WITH DIFFERENTIAL/PLATELET
Abs Immature Granulocytes: 0.02 10*3/uL (ref 0.00–0.07)
Basophils Absolute: 0 10*3/uL (ref 0.0–0.1)
Basophils Relative: 1 %
Eosinophils Absolute: 0 10*3/uL (ref 0.0–0.5)
Eosinophils Relative: 0 %
HCT: 46.1 % (ref 39.0–52.0)
Hemoglobin: 15.5 g/dL (ref 13.0–17.0)
Immature Granulocytes: 0 %
Lymphocytes Relative: 31 %
Lymphs Abs: 2.7 10*3/uL (ref 0.7–4.0)
MCH: 31.9 pg (ref 26.0–34.0)
MCHC: 33.6 g/dL (ref 30.0–36.0)
MCV: 94.9 fL (ref 80.0–100.0)
Monocytes Absolute: 0.7 10*3/uL (ref 0.1–1.0)
Monocytes Relative: 8 %
Neutro Abs: 5 10*3/uL (ref 1.7–7.7)
Neutrophils Relative %: 60 %
Platelets: 331 10*3/uL (ref 150–400)
RBC: 4.86 MIL/uL (ref 4.22–5.81)
RDW: 13 % (ref 11.5–15.5)
WBC: 8.5 10*3/uL (ref 4.0–10.5)
nRBC: 0 % (ref 0.0–0.2)

## 2019-08-30 LAB — COMPREHENSIVE METABOLIC PANEL
ALT: 25 U/L (ref 0–44)
AST: 17 U/L (ref 15–41)
Albumin: 4.4 g/dL (ref 3.5–5.0)
Alkaline Phosphatase: 54 U/L (ref 38–126)
Anion gap: 12 (ref 5–15)
BUN: 17 mg/dL (ref 8–23)
CO2: 22 mmol/L (ref 22–32)
Calcium: 9.2 mg/dL (ref 8.9–10.3)
Chloride: 102 mmol/L (ref 98–111)
Creatinine, Ser: 1.2 mg/dL (ref 0.61–1.24)
GFR calc Af Amer: >60 mL/min (ref >60)
GFR calc non Af Amer: 60 mL/min — ABNORMAL LOW (ref >60)
Glucose, Bld: 114 mg/dL — ABNORMAL HIGH (ref 70–99)
Potassium: 3.7 mmol/L (ref 3.5–5.1)
Sodium: 136 mmol/L (ref 135–145)
Total Bilirubin: 1.3 mg/dL — ABNORMAL HIGH (ref 0.3–1.2)
Total Protein: 8.4 g/dL — ABNORMAL HIGH (ref 6.5–8.1)

## 2019-08-30 LAB — URINALYSIS, ROUTINE W REFLEX MICROSCOPIC
Bilirubin Urine: NEGATIVE
Glucose, UA: NEGATIVE mg/dL
Ketones, ur: 15 mg/dL — AB
Leukocytes,Ua: NEGATIVE
Nitrite: NEGATIVE
Protein, ur: 30 mg/dL — AB
Specific Gravity, Urine: >1.03 — ABNORMAL HIGH (ref 1.005–1.030)
pH: 5.5 (ref 5.0–8.0)

## 2019-08-30 LAB — URINALYSIS, MICROSCOPIC (REFLEX)

## 2019-08-30 LAB — TROPONIN I (HIGH SENSITIVITY)
Troponin I (High Sensitivity): 11 ng/L (ref <18)
Troponin I (High Sensitivity): 14 ng/L (ref <18)

## 2019-08-30 LAB — LIPASE, BLOOD: Lipase: 81 U/L — ABNORMAL HIGH (ref 11–51)

## 2019-08-30 MED ORDER — ONDANSETRON HCL 4 MG/2ML IJ SOLN
4.0000 mg | Freq: Once | INTRAMUSCULAR | Status: AC
  Administered 2019-08-30: 4 mg via INTRAVENOUS
  Filled 2019-08-30: qty 2

## 2019-08-30 MED ORDER — SUCRALFATE 1 G PO TABS
1.0000 g | ORAL_TABLET | Freq: Three times a day (TID) | ORAL | 0 refills | Status: DC
Start: 2019-08-30 — End: 2021-02-02

## 2019-08-30 MED ORDER — ONDANSETRON HCL 4 MG PO TABS
4.0000 mg | ORAL_TABLET | Freq: Three times a day (TID) | ORAL | 0 refills | Status: DC | PRN
Start: 2019-08-30 — End: 2021-02-02

## 2019-08-30 MED ORDER — ALUM & MAG HYDROXIDE-SIMETH 200-200-20 MG/5ML PO SUSP
30.0000 mL | Freq: Once | ORAL | Status: AC
  Administered 2019-08-30: 30 mL via ORAL
  Filled 2019-08-30: qty 30

## 2019-08-30 MED ORDER — OMEPRAZOLE 20 MG PO CPDR
20.0000 mg | DELAYED_RELEASE_CAPSULE | Freq: Every day | ORAL | 0 refills | Status: DC
Start: 2019-08-30 — End: 2021-12-07

## 2019-08-30 MED ORDER — LIDOCAINE VISCOUS HCL 2 % MT SOLN
15.0000 mL | Freq: Once | OROMUCOSAL | Status: AC
  Administered 2019-08-30: 15 mL via ORAL
  Filled 2019-08-30: qty 15

## 2019-08-30 NOTE — ED Notes (Signed)
Provider bedside.

## 2019-08-30 NOTE — ED Notes (Signed)
Pt transported to [facility]

## 2019-08-30 NOTE — ED Provider Notes (Signed)
[name] Provider Note   CSN: [id number] Arrival date & time: 08/30/19  1415     History Chief Complaint  Patient presents with  . Chest Pain    Joshua Solis is a 74 y.o. male.  The history is provided by the patient and medical records. No language interpreter was used.  Chest Pain Pain location:  Substernal area and epigastric Pain quality: aching and burning   Pain radiates to:  Epigastrium Pain severity:  Severe Onset quality:  Gradual Timing:  Constant Progression:  Waxing and waning Chronicity:  New Relieved by:  Nothing Exacerbated by: eating. Ineffective treatments:  None tried Associated symptoms: abdominal pain, heartburn and nausea   Associated symptoms: no anxiety, no back pain, no claudication, no cough, no diaphoresis, no dizziness, no fatigue, no fever, no headache, no lower extremity edema, no near-syncope, no numbness, no palpitations, no shortness of breath, no syncope, no vomiting and no weakness   Risk factors: hypertension and male sex   Risk factors: no diabetes mellitus and no prior DVT/PE        Past Medical History:  Diagnosis Date  . CKD (chronic kidney disease), stage II    a. Cr 1.3 during 09/2015 adm, chronicity unclear at that time; f/u value 1.18.  . Diastolic dysfunction without heart failure   . Enlarged prostate   . Essential hypertension   . Fatty liver   . Hyperlipidemia   . Hypertensive heart disease   . Hypokalemia     Patient Active Problem List   Diagnosis Date Noted  . Knee effusion, right 07/30/2019  . Abnormal stress test 08/02/2016  . Hyperlipidemia 08/02/2016  . Chest pain 08/17/2015  . Essential hypertension 08/17/2015  . Renal insufficiency 08/17/2015  . Hypokalemia 08/17/2015    Past Surgical History:  Procedure Laterality Date  . HERNIA REPAIR     Inguinal        Family History  Problem Relation Age of Onset  . Stroke Mother 71  . Pneumonia Mother     Social  History   Tobacco Use  . Smoking status: Never Smoker  . Smokeless tobacco: Never Used  Substance Use Topics  . Alcohol use: Yes    Alcohol/week: 2.0 standard drinks    Types: 2 Standard drinks or equivalent per week    Comment: weekends  . Drug use: No    Home Medications Prior to Admission medications   Medication Sig Start Date End Date Taking? Authorizing Provider  amLODipine (NORVASC) 10 MG tablet Take 10 mg by mouth daily.    [provider]  ASPIRIN LOW DOSE 81 MG EC tablet TAKE 1 TABLET(81 MG) BY MOUTH DAILY 06/18/18   Minus Breeding, MD  atorvastatin (LIPITOR) 20 MG tablet Take 1 tablet (20 mg total) by mouth at bedtime. 08/18/15   Rai, Ripudeep K, MD  calcium carbonate (TUMS - DOSED IN MG ELEMENTAL CALCIUM) 500 MG chewable tablet Chew 1 tablet by mouth daily.    [provider]  cetirizine (ZYRTEC) 10 MG tablet Take 10 mg by mouth daily.    [provider]  ibuprofen (ADVIL,MOTRIN) 200 MG tablet Take 400 mg by mouth every 6 (six) hours as needed for moderate pain.    [provider]  naproxen (NAPROSYN) 500 MG tablet Take 1 tablet (500 mg total) by mouth 2 (two) times daily as needed. 07/30/19 07/29/20  Rosemarie Ax, MD  nitroGLYCERIN (NITROSTAT) 0.4 MG SL tablet Place 0.4 mg under the tongue every  5 (five) minutes as needed for chest pain (x 3 doses).    [provider]  tamsulosin (FLOMAX) 0.4 MG CAPS capsule Take 0.4 mg by mouth daily.     [provider]    Allergies    Patient has no known allergies.  Review of Systems   Review of Systems  Constitutional: Negative for chills, diaphoresis, fatigue and fever.  HENT: Negative for congestion.   Eyes: Negative for visual disturbance.  Respiratory: Negative for cough, chest tightness, shortness of breath, wheezing and stridor.   Cardiovascular: Positive for chest pain. Negative for palpitations, claudication, leg swelling, syncope and near-syncope.    Gastrointestinal: Positive for abdominal pain, heartburn and nausea. Negative for abdominal distention, constipation, diarrhea and vomiting.  Genitourinary: Negative for dysuria, flank pain and frequency.  Musculoskeletal: Negative for back pain, neck pain and neck stiffness.  Skin: Negative for rash and wound.  Neurological: Negative for dizziness, weakness, light-headedness, numbness and headaches.  Psychiatric/Behavioral: Negative for agitation, behavioral problems and confusion.  All other systems reviewed and are negative.   Physical Exam Updated Vital Signs BP (!) 181/87 (BP Location: Left Arm)   Pulse 75   Temp 98.2 F (36.8 C) (Oral)   Resp 20   Ht 5\' 8"  (1.727 m)   Wt 83.9 kg   SpO2 100%   BMI 28.13 kg/m   Physical Exam Vitals and nursing note reviewed.  Constitutional:      General: He is not in acute distress.    Appearance: He is well-developed. He is not ill-appearing, toxic-appearing or diaphoretic.  HENT:     Head: Normocephalic and atraumatic.  Eyes:     Conjunctiva/sclera: Conjunctivae normal.     Pupils: Pupils are equal, round, and reactive to light.  Cardiovascular:     Rate and Rhythm: Normal rate and regular rhythm.     Heart sounds: Normal heart sounds. No murmur heard.   Pulmonary:     Effort: Pulmonary effort is normal. No tachypnea or respiratory distress.     Breath sounds: Normal breath sounds. No decreased breath sounds, wheezing, rhonchi or rales.  Chest:     Chest wall: No tenderness.  Abdominal:     Palpations: Abdomen is soft.     Tenderness: There is abdominal tenderness.  Musculoskeletal:     Cervical back: Neck supple.     Right lower leg: No tenderness. No edema.     Left lower leg: No tenderness. No edema.  Skin:    General: Skin is warm and dry.     Capillary Refill: Capillary refill takes less than 2 seconds.     Findings: No erythema.  Neurological:     General: No focal deficit present.     Mental Status: He is alert.   Psychiatric:        Mood and Affect: Mood normal. Mood is not anxious.     ED Results / Procedures / Treatments   Labs (all labs ordered are listed, but only abnormal results are displayed) Labs Reviewed  COMPREHENSIVE METABOLIC PANEL - Abnormal; Notable for the following components:      Result Value   Glucose, Bld 114 (*)    Total Protein 8.4 (*)    Total Bilirubin 1.3 (*)    GFR calc non Af Amer 60 (*)    All other components within normal limits  LIPASE, BLOOD - Abnormal; Notable for the following components:   Lipase 81 (*)    All other components within normal limits  URINALYSIS, ROUTINE W REFLEX MICROSCOPIC - Abnormal; Notable for the following components:   Specific Gravity, Urine >1.030 (*)    Hgb urine dipstick TRACE (*)    Ketones, ur 15 (*)    Protein, ur 30 (*)    All other components within normal limits  URINALYSIS, MICROSCOPIC (REFLEX) - Abnormal; Notable for the following components:   Bacteria, UA FEW (*)    All other components within normal limits  CBC WITH DIFFERENTIAL/PLATELET  TROPONIN I (HIGH SENSITIVITY)  TROPONIN I (HIGH SENSITIVITY)    EKG EKG Interpretation  Date/Time:  Monday August 30 2019 14:26:33 EDT Ventricular Rate:  86 PR Interval:    QRS Duration: 96 QT Interval:  353 QTC Calculation: 423 R Axis:   1 Text Interpretation: Sinus rhythm LAE, consider biatrial enlargement Anteroseptal infarct, old No significant change since 08/17/2015 Confirmed by Geoffery LyonseLo, Douglas ([id number]) on 08/30/2019 2:38:10 PM   Radiology DG Chest 2 View  Result Date: 08/30/2019 CLINICAL DATA:  Chest pain EXAM: CHEST - 2 VIEW COMPARISON:  08/30/2019 FINDINGS: The heart size and mediastinal contours are within normal limits. Both lungs are clear. The visualized skeletal structures are unremarkable. IMPRESSION: No active cardiopulmonary disease. Electronically Signed   By: Alcide CleverMark  Lukens M.D.   On: 08/30/2019 15:35   US Abdomen Limited RUQ  Result Date:  08/30/2019 CLINICAL DATA:  Right upper quadrant pain EXAM: ULTRASOUND ABDOMEN LIMITED RIGHT UPPER QUADRANT COMPARISON:  None. FINDINGS: Gallbladder: No gallstones or wall thickening visualized. No sonographic Murphy sign noted by sonographer. Common bile duct: Diameter: 3.6 mm. Liver: Mild increased echogenicity is noted consistent with fatty infiltration. No focal mass is seen. Portal vein is patent on color Doppler imaging with normal direction of blood flow towards the liver. Other: None. IMPRESSION: Fatty liver.  No other focal abnormality is noted. Electronically Signed   By: Alcide CleverMark  Lukens M.D.   On: 08/30/2019 16:25    Procedures Procedures (including critical care time)  Medications Ordered in ED Medications  alum & mag hydroxide-simeth (MAALOX/MYLANTA) 200-200-20 MG/5ML suspension 30 mL (30 mLs Oral Given 08/30/19 1540)    And  lidocaine (XYLOCAINE) 2 % viscous mouth solution 15 mL (15 mLs Oral Given 08/30/19 1540)  ondansetron (ZOFRAN) injection 4 mg (4 mg Intravenous Given 08/30/19 1538)     ED Course  I have reviewed the triage vital signs and the nursing notes.  Pertinent labs & imaging results that were available during my care of the patient were reviewed by me and considered in my medical decision making (see chart for details).    MDM Rules/Calculators/A&P                          Joshua Solis is a 74 y.o. male with a past medical history significant for hypertension, hyperlipidemia, CKD, diastolic dysfunction without heart failure, fatty liver, and prior knee aspiration who presents with epigastric, right upper quadrant, and chest discomfort.  Patient reports that he is currently scheduled to get a right upper quadrant ultrasound upcoming because his cardiologist is concerned that his discomfort he has been experiencing for the last 2 weeks is more gallbladder mediated.  He says that for the last 2 weeks he has been having pain that is burning and aching in his epigastrium  radiating into his chest with burning and discomfort.  He reports that all started after ribs at [employer] and he has been feeling bad since.  He reports initially had nausea, vomiting, pain, and diarrhea  but the diarrhea subsided.  He is still having pain with the nausea and vomiting at times.  He reports that he had a cardiac work-up and sees a cardiologist in the past but they recently saw him and did not feel he had cardiac pain.  He denies any exertional component of the discomfort.  Denies shortness of breath, fevers, chills, cough, leg pain, leg swelling, history of DVT/PE, diaphoresis.  He denies any current bowel changes.  No urinary complaints.  No history of abdominal surgeries.  He describes his pain as 10 out of 10 at this time.  He reports it is worse when he lays down at night and lays flat.  He reports it is a burning discomfort.  On exam, epigastric and right upper quadrant is tender to palpation.  Lungs are clear and chest is nontender.  No murmur.  Normal bowel sounds.  No flank or back tenderness.  No rash seen.  Good pulses in all extremities.  No lower extremity tenderness or edema.  Patient resting comfortably describing the severe pain.  EKG shows no STEMI.  Based on his description of symptoms, I am primarily concerned about reflux discomfort versus gallbladder versus other.  We will get chest x-ray, abdomen ultrasound, and labs.  Will give GI cocktail antinausea medicine initially.  If work-up is reassuring, dissipate follow-up with a PCP and gastroenterologist for further management.  Would likely start Carafate and Prilosec if we feel it is more reflux related.  Patient's blood pressure is elevated however, pain does not radiate to back and his symptoms may be related to eating and foods and is not sharp and tearing, low suspicion for aortic etiology at this time.   6:38 PM Work-up returned overall reassuring aside from slightly elevated lipase.  Suspect mild pancreatitis versus  gastritis.  He will be given prescription for Carafate, Prilosec, Zofran, and will follow up with gastroenterology and PCP.  He agrees with plan of care and follow-up instructions.  Low suspicion for a cardiac cause based on negative troponins and description of symptoms.  Also his ultrasound was negative for gallbladder involvement.  Patient agreed with plan of care and was discharged in good condition with improved symptoms after medications.    Final Clinical Impression(s) / ED Diagnoses Final diagnoses:  RUQ abdominal pain  Epigastric pain  Chest pain, unspecified type  Nausea  Elevated lipase  Acute pancreatitis, unspecified complication status, unspecified pancreatitis type    Rx / DC Orders ED Discharge Orders         Ordered    sucralfate (CARAFATE) 1 g tablet  3 times daily with meals & bedtime     Discontinue  Reprint     08/30/19 1840    ondansetron (ZOFRAN) 4 MG tablet  Every 8 hours PRN     Discontinue  Reprint     08/30/19 1840    omeprazole (PRILOSEC) 20 MG capsule  Daily     Discontinue  Reprint     08/30/19 1840          Clinical Impression: 1. Epigastric pain   2. RUQ abdominal pain   3. Chest pain, unspecified type   4. Nausea   5. Elevated lipase   6. Acute pancreatitis, unspecified complication status, unspecified pancreatitis type     Disposition: Admit  This note was prepared with assistance of Dragon voice recognition software. Occasional wrong-word or sound-a-like substitutions may have occurred due to the inherent limitations of voice recognition software.  Jedrek Dinovo, Canary Brim, MD 08/30/19 [phone]

## 2019-08-30 NOTE — Discharge Instructions (Addendum)
Your work-up today was consistent with mild pancreatitis versus gastritis related to the food you been eating.  We discussed using Prilosec, Carafate, and treating her nausea.  Please maintain hydration and eat foods that are not spicy and greasy.  Please follow-up with your PCP and gastroenterologist for further management.  If any symptoms change or worsen, please return to the nearest emergency department.

## 2019-08-30 NOTE — ED Triage Notes (Signed)
Pt. Reports he ate ribs with sauce 2 wks ago and began hurting in his chest.  Now pt. Reports he can hardly eat.  Pt. Reports last night he ate chicken and Meister soup and started hurting in his mid center chest and vomited x 2.  No reports of diarrhea.  Pt. Reports pain 10/10.

## 2019-08-31 ENCOUNTER — Telehealth: Payer: Self-pay

## 2019-08-31 NOTE — Telephone Encounter (Addendum)
Left voice message for the patient.   ----- Message from Jodelle Gross, NP sent at 08/27/2019  3:22 PM EDT ----- Labs have been reviewed. He is not anemic. LDL cholesterol is not well controlled.Slightly elevated creatinine from NV and diarrhea. Potassium is normal. Liver enzymes are normal. Increase atorvastatin to 40 mg daily.  Awaiting ultrasound results.

## 2019-09-14 ENCOUNTER — Other Ambulatory Visit: Payer: Self-pay | Admitting: *Deleted

## 2019-09-14 ENCOUNTER — Telehealth: Payer: Self-pay | Admitting: *Deleted

## 2019-09-14 ENCOUNTER — Encounter: Payer: Self-pay | Admitting: *Deleted

## 2019-09-14 MED ORDER — ATORVASTATIN CALCIUM 40 MG PO TABS: 40.0000 mg | ORAL_TABLET | Freq: Every day | ORAL | 3 refills | Status: DC

## 2019-09-14 NOTE — Telephone Encounter (Signed)
Letter with result mailed to pt, Atorvastatin 40 mg send into pt pharmacy.

## 2019-09-14 NOTE — Telephone Encounter (Signed)
-----   Message from Jodelle Gross, NP sent at 08/27/2019  3:22 PM EDT ----- Labs have been reviewed. He is not anemic. LDL cholesterol is not well controlled.Slightly elevated creatinine from NV and diarrhea. Potassium is normal. Liver enzymes are normal. Increase atorvastatin to 40 mg daily.  Awaiting ultrasound results.

## 2019-10-01 ENCOUNTER — Ambulatory Visit (INDEPENDENT_AMBULATORY_CARE_PROVIDER_SITE_OTHER): Payer: Medicare Other | Admitting: Family Medicine

## 2019-10-01 ENCOUNTER — Other Ambulatory Visit: Payer: Self-pay

## 2019-10-01 ENCOUNTER — Encounter: Payer: Self-pay | Admitting: Family Medicine

## 2019-10-01 VITALS — BP 120/65 | HR 63 | Ht 68.0 in | Wt 180.0 lb

## 2019-10-01 DIAGNOSIS — M25461 Effusion, right knee: Secondary | ICD-10-CM

## 2019-10-01 NOTE — Patient Instructions (Signed)
Good to see you We will call you once the gel injections are in   Please send me a message in MyChart with any questions or updates.  We will set up a virtual visit once the MRi is resulted.   --Dr. Jordan Likes

## 2019-10-01 NOTE — Progress Notes (Signed)
Joshua Solis - 74 y.o. male MRN [id number]  Date of birth: 11/17/1945  SUBJECTIVE:  Including CC & ROS.  Chief Complaint  Patient presents with  . Follow-up    right knee    Joshua Solis is a 74 y.o. male that is presenting with acute worsening of his right knee pain.  We have tried injections with limited improvement.  The pain is anterior and has swelling from time to time.  Unable to do his normal activities.  This occurred after an injury at work..   Review of Systems See HPI   HISTORY: Past Medical, Surgical, Social, and Family History Reviewed & Updated per EMR.   Pertinent Historical Findings include:  Past Medical History:  Diagnosis Date  . CKD (chronic kidney disease), stage II    a. Cr 1.3 during 09/2015 adm, chronicity unclear at that time; f/u value 1.18.  . Diastolic dysfunction without heart failure   . Enlarged prostate   . Essential hypertension   . Fatty liver   . Hyperlipidemia   . Hypertensive heart disease   . Hypokalemia     Past Surgical History:  Procedure Laterality Date  . HERNIA REPAIR     Inguinal     Family History  Problem Relation Age of Onset  . Stroke Mother 16  . Pneumonia Mother     Social History   Socioeconomic History  . Marital status: Married    Spouse name: Not on file  . Number of children: 2  . Years of education: Not on file  . Highest education level: Not on file  Occupational History  . Occupation: Electronics engineer  Tobacco Use  . Smoking status: Never Smoker  . Smokeless tobacco: Never Used  Substance and Sexual Activity  . Alcohol use: Yes    Alcohol/week: 2.0 standard drinks    Types: 2 Standard drinks or equivalent per week    Comment: weekends  . Drug use: No  . Sexual activity: Not on file  Other Topics Concern  . Not on file  Social History Narrative   Lives at home with wife.     Social Determinants of Health   Financial Resource Strain:   . Difficulty of Paying Living Expenses:   Food  Insecurity:   . Worried About Programme researcher, broadcasting/film/video in the Last Year:   . Barista in the Last Year:   Transportation Needs:   . Freight forwarder (Medical):   Marland Kitchen Lack of Transportation (Non-Medical):   Physical Activity:   . Days of Exercise per Week:   . Minutes of Exercise per Session:   Stress:   . Feeling of Stress :   Social Connections:   . Frequency of Communication with Friends and Family:   . Frequency of Social Gatherings with Friends and Family:   . Attends Religious Services:   . Active Member of Clubs or Organizations:   . Attends Banker Meetings:   Marland Kitchen Marital Status:   Intimate Partner Violence:   . Fear of Current or Ex-Partner:   . Emotionally Abused:   Marland Kitchen Physically Abused:   . Sexually Abused:      PHYSICAL EXAM:  VS: BP 120/65   Pulse 63   Ht 5\' 8"  (1.727 m)   Wt 180 lb (81.6 kg)   BMI 27.37 kg/m  Physical Exam Gen: NAD, alert, cooperative with exam, well-appearing MSK:  Right knee: Mild effusion. Tenderness to palpation of the joint space. Instability with valgus  varus stress testing. Neurovascularly intact     ASSESSMENT & PLAN:   Knee effusion, right Occurring after an injury sustained at work.  Have tried conservative therapy and injection with limited improvement. -Pursue gel injections. -Referral to physical therapy. -MRI to evaluate for internal derangement.

## 2019-10-01 NOTE — Assessment & Plan Note (Signed)
Occurring after an injury sustained at work.  Have tried conservative therapy and injection with limited improvement. -Pursue gel injections. -Referral to physical therapy. -MRI to evaluate for internal derangement.

## 2019-10-05 ENCOUNTER — Other Ambulatory Visit: Payer: Self-pay | Admitting: Cardiology

## 2019-10-05 NOTE — Telephone Encounter (Signed)
*  STAT* If patient is at the pharmacy, call can be transferred to refill team.   1. Which medications need to be refilled? (please list name of each medication and dose if known)  atorvastatin (LIPITOR) 40 MG tablet ASPIRIN LOW DOSE 81 MG EC tablet amLODipine (NORVASC) 10 MG tablet  2. Which pharmacy/location (including street and city if local pharmacy) is medication to be sent to? [id number] - [place], [place] - [address] AT [facility] OF [address]  3. Do they need a 30 day or 90 day supply? 90 day

## 2019-10-06 NOTE — Telephone Encounter (Signed)
Patient is calling back to follow up on refill.

## 2019-10-07 MED ORDER — ASPIRIN 81 MG PO TBEC: DELAYED_RELEASE_TABLET | ORAL | 3 refills | Status: DC

## 2019-10-07 MED ORDER — ATORVASTATIN CALCIUM 40 MG PO TABS: 40.0000 mg | ORAL_TABLET | Freq: Every day | ORAL | 3 refills | Status: DC

## 2019-10-07 MED ORDER — AMLODIPINE BESYLATE 10 MG PO TABS: 10.0000 mg | ORAL_TABLET | Freq: Every day | ORAL | 3 refills | Status: AC

## 2019-10-28 ENCOUNTER — Telehealth: Payer: Self-pay | Admitting: Family Medicine

## 2019-10-28 NOTE — Telephone Encounter (Signed)
Faxed order for MRI & Ultrasound to [facility] @ [facility] [employer] :  Address :  [address]  [place], [place] [address]   Ph# [phone] / [phone] Fx# [phone] (doesn't wk ).  send to new fax # [phone]  [facility] clm# [id number]  Policy# WC [id number] --DOL 07/30/2019  --glh

## 2019-10-28 NOTE — Telephone Encounter (Signed)
Pt called to see when next OV scheduled--see none till after MRI  Preformed --Per pt hasn't had MRI yet..  --glh

## 2019-11-03 ENCOUNTER — Telehealth: Payer: Self-pay | Admitting: Family Medicine

## 2019-11-03 ENCOUNTER — Other Ambulatory Visit: Payer: Self-pay

## 2019-11-03 ENCOUNTER — Ambulatory Visit: Payer: Self-pay

## 2019-11-03 ENCOUNTER — Encounter: Payer: Self-pay | Admitting: Family Medicine

## 2019-11-03 ENCOUNTER — Ambulatory Visit (INDEPENDENT_AMBULATORY_CARE_PROVIDER_SITE_OTHER): Payer: Self-pay | Admitting: Family Medicine

## 2019-11-03 VITALS — Ht 68.0 in | Wt 184.0 lb

## 2019-11-03 DIAGNOSIS — M25461 Effusion, right knee: Secondary | ICD-10-CM

## 2019-11-03 NOTE — Patient Instructions (Signed)
Good to see you Please try ice  This may take a few weeks before you notice improvement of pain with the gel injection.   Please send me a message in MyChart with any questions or updates.  We will setup a virtual visit once the MRI is resulted.   --Dr. Jordan Likes

## 2019-11-03 NOTE — Telephone Encounter (Signed)
Contacted Berneice Heinrich @ [employer] @ [phone] x [phone] -Provider seek approval for Injectable Synvisc --  Verbal approval given for a One time injection by Clm rep/ Judeth Cornfield @ [employer].  --glh

## 2019-11-03 NOTE — Progress Notes (Signed)
Joshua Solis - 74 y.o. male MRN [id number]  Date of birth: [birth year]  SUBJECTIVE:  Including CC & ROS.  Chief Complaint  Patient presents with  . Knee Pain    right     Joshua Solis is a 74 y.o. male that is presenting with acute worsening of his right knee pain.  We have tried home therapy as well as steroid injection with limited improvement.  Pain is ongoing after an injury sustained at work.   Review of Systems See HPI   HISTORY: Past Medical, Surgical, Social, and Family History Reviewed & Updated per EMR.   Pertinent Historical Findings include:  Past Medical History:  Diagnosis Date  . CKD (chronic kidney disease), stage II    a. Cr 1.3 during 09/2015 adm, chronicity unclear at that time; f/u value 1.18.  . Diastolic dysfunction without heart failure   . Enlarged prostate   . Essential hypertension   . Fatty liver   . Hyperlipidemia   . Hypertensive heart disease   . Hypokalemia     Past Surgical History:  Procedure Laterality Date  . HERNIA REPAIR     Inguinal     Family History  Problem Relation Age of Onset  . Stroke Mother 63  . Pneumonia Mother     Social History   Socioeconomic History  . Marital status: Married    Spouse name: Not on file  . Number of children: 2  . Years of education: Not on file  . Highest education level: Not on file  Occupational History  . Occupation: Electronics engineer  Tobacco Use  . Smoking status: Never Smoker  . Smokeless tobacco: Never Used  Substance and Sexual Activity  . Alcohol use: Yes    Alcohol/week: 2.0 standard drinks    Types: 2 Standard drinks or equivalent per week    Comment: weekends  . Drug use: No  . Sexual activity: Not on file  Other Topics Concern  . Not on file  Social History Narrative   Lives at home with wife.     Social Determinants of Health   Financial Resource Strain:   . Difficulty of Paying Living Expenses: Not on file  Food Insecurity:   . Worried About Programme researcher, broadcasting/film/video in  the Last Year: Not on file  . Ran Out of Food in the Last Year: Not on file  Transportation Needs:   . Lack of Transportation (Medical): Not on file  . Lack of Transportation (Non-Medical): Not on file  Physical Activity:   . Days of Exercise per Week: Not on file  . Minutes of Exercise per Session: Not on file  Stress:   . Feeling of Stress : Not on file  Social Connections:   . Frequency of Communication with Friends and Family: Not on file  . Frequency of Social Gatherings with Friends and Family: Not on file  . Attends Religious Services: Not on file  . Active Member of Clubs or Organizations: Not on file  . Attends Banker Meetings: Not on file  . Marital Status: Not on file  Intimate Partner Violence:   . Fear of Current or Ex-Partner: Not on file  . Emotionally Abused: Not on file  . Physically Abused: Not on file  . Sexually Abused: Not on file     PHYSICAL EXAM:  VS: Ht 5\' 8"  (1.727 m)   Wt 184 lb (83.5 kg)   BMI 27.98 kg/m  Physical Exam Gen: NAD, alert, cooperative with  exam, well-appearing MSK:  Right knee: Effusion present. Normal range of motion. Tenderness palpation along the joint line. Neurovascularly intact   Aspiration/Injection Procedure Note Joshua Solis [birth year]  Procedure: Injection Indications: Right knee pain  Procedure Details Consent: Risks of procedure as well as the alternatives and risks of each were explained to the (patient/caregiver).  Consent for procedure obtained. Time Out: Verified patient identification, verified procedure, site/side was marked, verified correct patient position, special equipment/implants available, medications/allergies/relevent history reviewed, required imaging and test results available.  Performed.  The area was cleaned with iodine and alcohol swabs.    The right knee superior lateral suprapatellar pouch was injected using 4 cc's of 1% lidocaine with a 22 1 1/2" needle.  The syringe was  switched and one syringe/38mL of synviscone was injected. Ultrasound was used. Images were obtained in  Long views showing the injection.    A sterile dressing was applied.  Patient did tolerate procedure well.    ASSESSMENT & PLAN:   Knee effusion, right Having ongoing knee pain.  Limited improvement with conservative measures thus far. -Counseled on supportive care. -Synvisc 1 injection today. -Has MRI of his knee pending.

## 2019-11-03 NOTE — Assessment & Plan Note (Signed)
Having ongoing knee pain.  Limited improvement with conservative measures thus far. -Counseled on supportive care. -Synvisc 1 injection today. -Has MRI of his knee pending.

## 2019-11-04 ENCOUNTER — Telehealth: Payer: Self-pay | Admitting: Family Medicine

## 2019-11-04 NOTE — Telephone Encounter (Signed)
[employer] rep/ [facility] @ [phone]-- Provider's signature missing from referral form.  --forwarding message to med asst to review submitted form & obtain signature.     Fax it to (Two different fax#s)   Attn [facility] @ [phone]   Directly to dept @ [phone]  --glh

## 2019-11-24 ENCOUNTER — Telehealth: Payer: Self-pay | Admitting: Family Medicine

## 2019-11-24 NOTE — Telephone Encounter (Signed)
Pt called states he had his MRI at [facility] & wants to know the results & to ask provider when is next appt (TBD).  --Called pt back left message fr him to call office again & will happy to schedule.  --glh

## 2019-11-25 ENCOUNTER — Encounter: Payer: Self-pay | Admitting: Family Medicine

## 2019-11-25 ENCOUNTER — Other Ambulatory Visit: Payer: Self-pay

## 2019-11-25 ENCOUNTER — Ambulatory Visit (INDEPENDENT_AMBULATORY_CARE_PROVIDER_SITE_OTHER): Payer: Self-pay | Admitting: Family Medicine

## 2019-11-25 VITALS — BP 128/78 | HR 70 | Ht 68.0 in | Wt 184.0 lb

## 2019-11-25 DIAGNOSIS — S83206D Unspecified tear of unspecified meniscus, current injury, right knee, subsequent encounter: Secondary | ICD-10-CM

## 2019-11-25 NOTE — Assessment & Plan Note (Signed)
Occurring after an injury sustained at work.  Review of the MRI scan today was revealing for a tear of the medial meniscus in the posterior horn.  He also had an effusion apparent.  We have try gel and steroid injection. -Counseled on home exercise therapy and supportive care. -Provided work note. -Referral to physical therapy. -Could consider PRP injection.

## 2019-11-25 NOTE — Patient Instructions (Signed)
Good to see you  Please try physical therapy  Please use ice as needed Please send me a message in MyChart with any questions or updates.  Please see me back in 6 weeks.   --Dr. Jordan Likes

## 2019-11-25 NOTE — Progress Notes (Signed)
Joshua Solis - 74 y.o. male MRN [id number]  Date of birth: [birth year]  SUBJECTIVE:  Including CC & ROS.  Chief Complaint  Patient presents with  . Follow-up    right knee    Joshua Solis is a 74 y.o. male that is following up after the MRI of his right knee.  It was revealing for a tear of the posterior horn of the medial meniscus.  There is also revealing for an effusion.   Review of Systems See HPI   HISTORY: Past Medical, Surgical, Social, and Family History Reviewed & Updated per EMR.   Pertinent Historical Findings include:  Past Medical History:  Diagnosis Date  . CKD (chronic kidney disease), stage II    a. Cr 1.3 during 09/2015 adm, chronicity unclear at that time; f/u value 1.18.  . Diastolic dysfunction without heart failure   . Enlarged prostate   . Essential hypertension   . Fatty liver   . Hyperlipidemia   . Hypertensive heart disease   . Hypokalemia     Past Surgical History:  Procedure Laterality Date  . HERNIA REPAIR     Inguinal     Family History  Problem Relation Age of Onset  . Stroke Mother 105  . Pneumonia Mother     Social History   Socioeconomic History  . Marital status: Married    Spouse name: Not on file  . Number of children: 2  . Years of education: Not on file  . Highest education level: Not on file  Occupational History  . Occupation: Electronics engineer  Tobacco Use  . Smoking status: Never Smoker  . Smokeless tobacco: Never Used  Substance and Sexual Activity  . Alcohol use: Yes    Alcohol/week: 2.0 standard drinks    Types: 2 Standard drinks or equivalent per week    Comment: weekends  . Drug use: No  . Sexual activity: Not on file  Other Topics Concern  . Not on file  Social History Narrative   Lives at home with wife.     Social Determinants of Health   Financial Resource Strain:   . Difficulty of Paying Living Expenses: Not on file  Food Insecurity:   . Worried About Programme researcher, broadcasting/film/video in the Last Year: Not on  file  . Ran Out of Food in the Last Year: Not on file  Transportation Needs:   . Lack of Transportation (Medical): Not on file  . Lack of Transportation (Non-Medical): Not on file  Physical Activity:   . Days of Exercise per Week: Not on file  . Minutes of Exercise per Session: Not on file  Stress:   . Feeling of Stress : Not on file  Social Connections:   . Frequency of Communication with Friends and Family: Not on file  . Frequency of Social Gatherings with Friends and Family: Not on file  . Attends Religious Services: Not on file  . Active Member of Clubs or Organizations: Not on file  . Attends Banker Meetings: Not on file  . Marital Status: Not on file  Intimate Partner Violence:   . Fear of Current or Ex-Partner: Not on file  . Emotionally Abused: Not on file  . Physically Abused: Not on file  . Sexually Abused: Not on file     PHYSICAL EXAM:  VS: BP 128/78   Pulse 70   Ht 5\' 8"  (1.727 m)   Wt 184 lb (83.5 kg)   BMI 27.98 kg/m  Physical  Exam Gen: NAD, alert, cooperative with exam, well-appearing   ASSESSMENT & PLAN:   Acute meniscal tear of knee, right, subsequent encounter Occurring after an injury sustained at work.  Review of the MRI scan today was revealing for a tear of the medial meniscus in the posterior horn.  He also had an effusion apparent.  We have try gel and steroid injection. -Counseled on home exercise therapy and supportive care. -Provided work note. -Referral to physical therapy. -Could consider PRP injection.

## 2020-01-07 ENCOUNTER — Encounter: Payer: Self-pay | Admitting: Family Medicine

## 2020-01-07 ENCOUNTER — Ambulatory Visit: Payer: Self-pay

## 2020-01-07 ENCOUNTER — Ambulatory Visit (INDEPENDENT_AMBULATORY_CARE_PROVIDER_SITE_OTHER): Payer: Self-pay | Admitting: Family Medicine

## 2020-01-07 ENCOUNTER — Other Ambulatory Visit: Payer: Self-pay

## 2020-01-07 DIAGNOSIS — S83206D Unspecified tear of unspecified meniscus, current injury, right knee, subsequent encounter: Secondary | ICD-10-CM

## 2020-01-07 NOTE — Patient Instructions (Addendum)
Good to see you Please try compression  Please try ice  Please continue the brace   Please send me a message in MyChart with any questions or updates.  Please see me back in 6 weeks.   --Dr. Jordan Likes

## 2020-01-07 NOTE — Assessment & Plan Note (Addendum)
Following up for his right knee pain after an injury sustained at work.  Exacerbation of effusion today. Physical therapy hasn't been set up yet.  -Counseled on home exercise therapy and supportive care. -Aspiration today. -Advised to discuss with his adjuster about physical therapy. -Provided work note. -Could consider PRP.

## 2020-01-07 NOTE — Progress Notes (Addendum)
Joshua Solis - 74 y.o. male MRN [id number]  Date of birth: 07/06/1945  SUBJECTIVE:  Including CC & ROS.  Chief Complaint  Patient presents with  . Follow-up    right knee    Joshua Solis is a 74 y.o. male that is following up for his right knee pain.  It is a result of an injury that he sustained at work.  It seems to have exacerbated recently with worsening swelling.  He has not been able to start physical therapy yet.   Review of Systems See HPI   HISTORY: Past Medical, Surgical, Social, and Family History Reviewed & Updated per EMR.   Pertinent Historical Findings include:  Past Medical History:  Diagnosis Date  . CKD (chronic kidney disease), stage II    a. Cr 1.3 during 09/2015 adm, chronicity unclear at that time; f/u value 1.18.  . Diastolic dysfunction without heart failure   . Enlarged prostate   . Essential hypertension   . Fatty liver   . Hyperlipidemia   . Hypertensive heart disease   . Hypokalemia     Past Surgical History:  Procedure Laterality Date  . HERNIA REPAIR     Inguinal     Family History  Problem Relation Age of Onset  . Stroke Mother 57  . Pneumonia Mother     Social History   Socioeconomic History  . Marital status: Married    Spouse name: Not on file  . Number of children: 2  . Years of education: Not on file  . Highest education level: Not on file  Occupational History  . Occupation: Electronics engineer  Tobacco Use  . Smoking status: Never Smoker  . Smokeless tobacco: Never Used  Substance and Sexual Activity  . Alcohol use: Yes    Alcohol/week: 2.0 standard drinks    Types: 2 Standard drinks or equivalent per week    Comment: weekends  . Drug use: No  . Sexual activity: Not on file  Other Topics Concern  . Not on file  Social History Narrative   Lives at home with wife.     Social Determinants of Health   Financial Resource Strain:   . Difficulty of Paying Living Expenses: Not on file  Food Insecurity:   . Worried About  Programme researcher, broadcasting/film/video in the Last Year: Not on file  . Ran Out of Food in the Last Year: Not on file  Transportation Needs:   . Lack of Transportation (Medical): Not on file  . Lack of Transportation (Non-Medical): Not on file  Physical Activity:   . Days of Exercise per Week: Not on file  . Minutes of Exercise per Session: Not on file  Stress:   . Feeling of Stress : Not on file  Social Connections:   . Frequency of Communication with Friends and Family: Not on file  . Frequency of Social Gatherings with Friends and Family: Not on file  . Attends Religious Services: Not on file  . Active Member of Clubs or Organizations: Not on file  . Attends Banker Meetings: Not on file  . Marital Status: Not on file  Intimate Partner Violence:   . Fear of Current or Ex-Partner: Not on file  . Emotionally Abused: Not on file  . Physically Abused: Not on file  . Sexually Abused: Not on file     PHYSICAL EXAM:  VS: BP (!) 149/83   Pulse 67   Ht 5\' 8"  (1.727 m)   Wt 189  lb (85.7 kg)   BMI 28.74 kg/m  Physical Exam Gen: NAD, alert, cooperative with exam, well-appearing MSK:  Right knee: Moderate effusion. Normal range of motion. No instability with valgus or varus stress testing. Neurovascular intact   Aspiration/Injection Procedure Note Joshua Solis [birth year]  Procedure: Aspiration Indications: Right knee pain  Procedure Details Consent: Risks of procedure as well as the alternatives and risks of each were explained to the (patient/caregiver).  Consent for procedure obtained. Time Out: Verified patient identification, verified procedure, site/side was marked, verified correct patient position, special equipment/implants available, medications/allergies/relevent history reviewed, required imaging and test results available.  Performed.  The area was cleaned with iodine and alcohol swabs.    The right knee superior lateral suprapatellar pouch was injected using 5 cc's of  1% lidocaine with a 25 1 1/2" needle.  An 18-gauge 1-1/2 inch needle was used to achieve aspiration.  Ultrasound was used. Images were obtained in long views showing the injection.    Amount of Fluid Aspirated: 69mL Character of Fluid: clear and straw colored Fluid was sent for:n/a  A sterile dressing was applied.  Patient did tolerate procedure well.     ASSESSMENT & PLAN:   Acute meniscal tear of knee, right, subsequent encounter Following up for his right knee pain after an injury sustained at work.  Exacerbation of effusion today. Physical therapy hasn't been set up yet.  -Counseled on home exercise therapy and supportive care. -Aspiration today. -Advised to discuss with his adjuster about physical therapy. -Provided work note. -Could consider PRP.

## 2020-02-22 ENCOUNTER — Ambulatory Visit: Payer: PRIVATE HEALTH INSURANCE | Admitting: Family Medicine

## 2020-03-08 ENCOUNTER — Ambulatory Visit: Payer: PRIVATE HEALTH INSURANCE | Admitting: Family Medicine

## 2020-03-08 NOTE — Progress Notes (Deleted)
  Joshua Solis - 75 y.o. male MRN [id number]  Date of birth: 1945/07/30  SUBJECTIVE:  Including CC & ROS.  No chief complaint on file.   Joshua Solis is a 75 y.o. male that is  ***.  ***   Review of Systems See HPI   HISTORY: Past Medical, Surgical, Social, and Family History Reviewed & Updated per EMR.   Pertinent Historical Findings include:  Past Medical History:  Diagnosis Date  . CKD (chronic kidney disease), stage II    a. Cr 1.3 during 09/2015 adm, chronicity unclear at that time; f/u value 1.18.  . Diastolic dysfunction without heart failure   . Enlarged prostate   . Essential hypertension   . Fatty liver   . Hyperlipidemia   . Hypertensive heart disease   . Hypokalemia     Past Surgical History:  Procedure Laterality Date  . HERNIA REPAIR     Inguinal     Family History  Problem Relation Age of Onset  . Stroke Mother 42  . Pneumonia Mother     Social History   Socioeconomic History  . Marital status: Married    Spouse name: Not on file  . Number of children: 2  . Years of education: Not on file  . Highest education level: Not on file  Occupational History  . Occupation: Electronics engineer  Tobacco Use  . Smoking status: Never Smoker  . Smokeless tobacco: Never Used  Substance and Sexual Activity  . Alcohol use: Yes    Alcohol/week: 2.0 standard drinks    Types: 2 Standard drinks or equivalent per week    Comment: weekends  . Drug use: No  . Sexual activity: Not on file  Other Topics Concern  . Not on file  Social History Narrative   Lives at home with wife.     Social Determinants of Health   Financial Resource Strain: Not on file  Food Insecurity: Not on file  Transportation Needs: Not on file  Physical Activity: Not on file  Stress: Not on file  Social Connections: Not on file  Intimate Partner Violence: Not on file     PHYSICAL EXAM:  VS: There were no vitals taken for this visit. Physical Exam Gen: NAD, alert, cooperative with  exam, well-appearing MSK:  ***      ASSESSMENT & PLAN:   No problem-specific Assessment & Plan notes found for this encounter.

## 2020-03-10 ENCOUNTER — Other Ambulatory Visit: Payer: Self-pay

## 2020-03-10 ENCOUNTER — Ambulatory Visit (INDEPENDENT_AMBULATORY_CARE_PROVIDER_SITE_OTHER): Payer: Self-pay | Admitting: Family Medicine

## 2020-03-10 VITALS — BP 142/80 | Ht 68.0 in | Wt 184.0 lb

## 2020-03-10 DIAGNOSIS — S83206D Unspecified tear of unspecified meniscus, current injury, right knee, subsequent encounter: Secondary | ICD-10-CM

## 2020-03-10 NOTE — Assessment & Plan Note (Signed)
Pain is still occurring after an injury sustained at work.  Has had improvement with physical therapy. -Counseled on home exercise therapy and supportive care. -Pursue PRP. -Can continue physical therapy.

## 2020-03-10 NOTE — Progress Notes (Signed)
  Ezel Vallone - 75 y.o. male MRN [id number]  Date of birth: [birth year]  SUBJECTIVE:  Including CC & ROS.  No chief complaint on file.   Cashmere Harmes is a 74 y.o. male that is following up for his right knee pain.  He has been going through physical therapy and still notices pain intermittently.  It is worse when he is standing.  We did try gel injections as well as steroid injection.  Review of Systems See HPI   HISTORY: Past Medical, Surgical, Social, and Family History Reviewed & Updated per EMR.   Pertinent Historical Findings include:  Past Medical History:  Diagnosis Date  . CKD (chronic kidney disease), stage II    a. Cr 1.3 during 09/2015 adm, chronicity unclear at that time; f/u value 1.18.  . Diastolic dysfunction without heart failure   . Enlarged prostate   . Essential hypertension   . Fatty liver   . Hyperlipidemia   . Hypertensive heart disease   . Hypokalemia     Past Surgical History:  Procedure Laterality Date  . HERNIA REPAIR     Inguinal     Family History  Problem Relation Age of Onset  . Stroke Mother 24  . Pneumonia Mother     Social History   Socioeconomic History  . Marital status: Married    Spouse name: Not on file  . Number of children: 2  . Years of education: Not on file  . Highest education level: Not on file  Occupational History  . Occupation: Electronics engineer  Tobacco Use  . Smoking status: Never Smoker  . Smokeless tobacco: Never Used  Substance and Sexual Activity  . Alcohol use: Yes    Alcohol/week: 2.0 standard drinks    Types: 2 Standard drinks or equivalent per week    Comment: weekends  . Drug use: No  . Sexual activity: Not on file  Other Topics Concern  . Not on file  Social History Narrative   Lives at home with wife.     Social Determinants of Health   Financial Resource Strain: Not on file  Food Insecurity: Not on file  Transportation Needs: Not on file  Physical Activity: Not on file  Stress: Not on file   Social Connections: Not on file  Intimate Partner Violence: Not on file     PHYSICAL EXAM:  VS: BP (!) 142/80   Ht 5\' 8"  (1.727 m)   Wt 184 lb (83.5 kg)   BMI 27.98 kg/m  Physical Exam Gen: NAD, alert, cooperative with exam, well-appearing MSK:  Right knee: Mild effusion. Normal range of motion. Normal strength resistance. Neurovascular intact     ASSESSMENT & PLAN:   Acute meniscal tear of knee, right, subsequent encounter Pain is still occurring after an injury sustained at work.  Has had improvement with physical therapy. -Counseled on home exercise therapy and supportive care. -Pursue PRP. -Can continue physical therapy.

## 2020-03-28 DIAGNOSIS — I517 Cardiomegaly: Secondary | ICD-10-CM | POA: Insufficient documentation

## 2020-03-28 NOTE — Progress Notes (Signed)
Cardiology Office Note   Date:  03/30/2020   ID:  Roch Quach, DOB 11/23/1945, MRN [id number]  PCP:  Patient, No Pcp Per  Cardiologist:   Rollene Rotunda, MD  CC:  LVH on ECHO    History of Present Illness: Joshua Solis is a 75 y.o. male who presents forwho presents for follow-up of chest pain. He went to the [place] and had a stress perfusion study which demonstrated a small reversible inferior defect. The EF was well-preserved. He was referred to the [place] at [facility] to have cardiac catheterization. However, when he went to have that done he couldn't find parking so we didn't have the procedure. I saw him for the first and only time in 2018.  In 2017 he had a hypertensive response to on POET (Plain Old Exercise Treadmill) but no clear evidence of ischemia in 2017.   At that time he had moderate LVH on echo in 2020.   Since I last saw him he has done well.  He works at [employer] which is a physical job.  He has downloaded things.  With this he denies any cardiovascular symptoms.  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  However, he has no chest pressure, neck or arm discomfort.  He is not having any resting shortness of breath, PND or orthopnea.  He has had no weight gain or edema.  Past Medical History:  Diagnosis Date  . CKD (chronic kidney disease), stage II    a. Cr 1.3 during 09/2015 adm, chronicity unclear at that time; f/u value 1.18.  . Diastolic dysfunction without heart failure   . Enlarged prostate   . Essential hypertension   . Fatty liver   . Hyperlipidemia   . Hypertensive heart disease   . Hypokalemia     Past Surgical History:  Procedure Laterality Date  . HERNIA REPAIR     Inguinal      Current Outpatient Medications  Medication Sig Dispense Refill  . amLODipine (NORVASC) 10 MG tablet Take 1 tablet (10 mg total) by mouth daily. 90 tablet 3  . aspirin (ASPIRIN LOW DOSE) 81 MG EC tablet TAKE 1 TABLET(81 MG) BY MOUTH DAILY 90 tablet  3  . calcium carbonate (TUMS - DOSED IN MG ELEMENTAL CALCIUM) 500 MG chewable tablet Chew 1 tablet by mouth daily.    . cetirizine (ZYRTEC) 10 MG tablet Take 10 mg by mouth daily.    Marland Kitchen ibuprofen (ADVIL,MOTRIN) 200 MG tablet Take 400 mg by mouth every 6 (six) hours as needed for moderate pain.    . naproxen (NAPROSYN) 500 MG tablet Take 1 tablet (500 mg total) by mouth 2 (two) times daily as needed. 60 tablet 0  . nitroGLYCERIN (NITROSTAT) 0.4 MG SL tablet Place 0.4 mg under the tongue every 5 (five) minutes as needed for chest pain (x 3 doses).    Marland Kitchen omeprazole (PRILOSEC) 20 MG capsule Take 1 capsule (20 mg total) by mouth daily. 30 capsule 0  . ondansetron (ZOFRAN) 4 MG tablet Take 1 tablet (4 mg total) by mouth every 8 (eight) hours as needed for nausea or vomiting. 12 tablet 0  . sertraline (ZOLOFT) 100 MG tablet Take 100 mg by mouth at bedtime.    . sucralfate (CARAFATE) 1 g tablet Take 1 tablet (1 g total) by mouth 4 (four) times daily -  with meals and at bedtime. 30 tablet 0  . tamsulosin (FLOMAX) 0.4 MG CAPS capsule Take  0.4 mg by mouth daily.     Marland Kitchen terazosin (HYTRIN) 10 MG capsule Take 10 mg by mouth at bedtime.    Marland Kitchen atorvastatin (LIPITOR) 80 MG tablet Take 1 tablet (80 mg total) by mouth at bedtime. 90 tablet 3   No current facility-administered medications for this visit.    Allergies:   Patient has no known allergies.    ROS:  Please see the history of present illness.   Otherwise, review of systems are positive for none.   All other systems are reviewed and negative.    PHYSICAL EXAM: VS:  BP 124/62 (BP Location: Left Arm, Patient Position: Sitting, Cuff Size: Normal)   Pulse (!) 55   Ht 5\' 8"  (1.727 m)   Wt 179 lb (81.2 kg)   BMI 27.22 kg/m  , BMI Body mass index is 27.22 kg/m. GENERAL:  Well appearing NECK:  No jugular venous distention, waveform within normal limits, carotid upstroke brisk and symmetric, no bruits, no thyromegaly LUNGS:  Clear to auscultation  bilaterally CHEST:  Unremarkable HEART:  PMI not displaced or sustained,S1 and S2 within normal limits, no S3, no S4, no clicks, no rubs, no murmurs ABD:  Flat, positive bowel sounds normal in frequency in pitch, no bruits, no rebound, no guarding, no midline pulsatile mass, no hepatomegaly, no splenomegaly EXT:  2 plus pulses throughout, no edema, no cyanosis no clubbing   EKG:  EKG is  ordered today. The ekg ordered today demonstrates sinus rhythm, rate 55, axis within normal limits, intervals within normal limits, inferior T wave inversions consistent with repolarization changes.  No change from previous   Recent Labs: 08/30/2019: ALT 25; BUN 17; Creatinine, Ser 1.20; Hemoglobin 15.5; Platelets 331; Potassium 3.7; Sodium 136    Lipid Panel    Component Value Date/Time   CHOL 189 08/26/2019 0938   TRIG 167 (H) 08/26/2019 0938   HDL 34 (L) 08/26/2019 0938   CHOLHDL 5.6 (H) 08/26/2019 0938   CHOLHDL 5.0 08/18/2015 0228   VLDL 19 08/18/2015 0228   LDLCALC 125 (H) 08/26/2019 0938      Wt Readings from Last 3 Encounters:  03/30/20 179 lb (81.2 kg)  03/10/20 184 lb (83.5 kg)  01/07/20 189 lb (85.7 kg)      Other studies Reviewed: Additional studies/ records that were reviewed today include: Labs. Review of the above records demonstrates:  Please see elsewhere in the note.     ASSESSMENT AND PLAN:  LVH:    This was moderate but stable on echo last year.  His blood pressure is well controlled.  The LVH was unchanged on follow-up echo.  I think this is related to difficult to control hypertension and I will follow this clinically in the future with echocardiography.  For now the therapy is blood pressure control.   HTN: He is at target.  No change in therapy.  He will keep a blood pressure diary at home though to make sure we aren't target at home.   DYSLIPIDEMIA: I do not feel he is being treated by his primary provider for dyslipidemia and his LDL 125 on Lipitor 40.  If we're  going to treat this I would suggest a goal of 100 and so I'm going to increase his Lipitor to 80 mg and asked him to come back and get a lipid profile in 8 weeks.  Current medicines are reviewed at length with the patient today.  The patient does not have concerns regarding medicines.  The following changes  have been made: As above  Labs/ tests ordered today include:   Orders Placed This Encounter  Procedures  . Lipid panel  . EKG 12-Lead     Disposition:   FU with me in one year.    Signed, Rollene Rotunda, MD  03/30/2020 10:07 AM    [facility]

## 2020-03-30 ENCOUNTER — Ambulatory Visit: Payer: Medicare Other | Admitting: Cardiology

## 2020-03-30 ENCOUNTER — Encounter: Payer: Self-pay | Admitting: Cardiology

## 2020-03-30 ENCOUNTER — Other Ambulatory Visit: Payer: Self-pay

## 2020-03-30 VITALS — BP 124/62 | HR 55 | Ht 68.0 in | Wt 179.0 lb

## 2020-03-30 DIAGNOSIS — E785 Hyperlipidemia, unspecified: Secondary | ICD-10-CM

## 2020-03-30 DIAGNOSIS — Z5181 Encounter for therapeutic drug level monitoring: Secondary | ICD-10-CM

## 2020-03-30 DIAGNOSIS — I10 Essential (primary) hypertension: Secondary | ICD-10-CM

## 2020-03-30 DIAGNOSIS — I517 Cardiomegaly: Secondary | ICD-10-CM

## 2020-03-30 MED ORDER — ATORVASTATIN CALCIUM 80 MG PO TABS: 80.0000 mg | ORAL_TABLET | Freq: Every day | ORAL | 3 refills | Status: DC

## 2020-03-30 NOTE — Patient Instructions (Addendum)
Medication Instructions:  INCREASE YOUR ATORVASTATIN TO 80 MG DAILY   *If you need a refill on your cardiac medications before your next appointment, please call your pharmacy*  Lab Work: FASTING LP IN 8 WEEKS  If you have labs (blood work) drawn today and your tests are completely normal, you will receive your results only by: Marland Kitchen MyChart Message (if you have MyChart) OR . A paper copy in the mail If you have any lab test that is abnormal or we need to change your treatment, we will call you to review the results.  Testing/Procedures: NONE  Follow-Up: At [facility], you and your health needs are our priority.  As part of our continuing mission to provide you with exceptional heart care, we have created designated Provider Care Teams.  These Care Teams include your primary Cardiologist (physician) and Advanced Practice Providers (APPs -  Physician Assistants and Nurse Practitioners) who all work together to provide you with the care you need, when you need it.  We recommend signing up for the patient portal called "MyChart".  Sign up information is provided on this After Visit Summary.  MyChart is used to connect with patients for Virtual Visits (Telemedicine).  Patients are able to view lab/test results, encounter notes, upcoming appointments, etc.  Non-urgent messages can be sent to your provider as well.   To learn more about what you can do with MyChart, go to [email or web address].    Your next appointment:   12 MONTHS  You will receive a reminder letter in the mail two months in advance. If you don't receive a letter, please call our office to schedule the follow-up appointment.  The format for your next appointment:   In Person  Provider:   You may see Rollene Rotunda, MD or one of the following Advanced Practice Providers on your designated Care Team:    Theodore Demark, PA-C  Joni Reining, DNP, ANP

## 2020-04-18 ENCOUNTER — Ambulatory Visit: Payer: Self-pay

## 2020-04-18 ENCOUNTER — Other Ambulatory Visit: Payer: Self-pay

## 2020-04-18 ENCOUNTER — Ambulatory Visit (INDEPENDENT_AMBULATORY_CARE_PROVIDER_SITE_OTHER): Payer: Self-pay | Admitting: Family Medicine

## 2020-04-18 DIAGNOSIS — S83206D Unspecified tear of unspecified meniscus, current injury, right knee, subsequent encounter: Secondary | ICD-10-CM

## 2020-04-18 NOTE — Progress Notes (Signed)
Sasuke Yaffe - 75 y.o. male MRN [id number]  Date of birth: [birth year]  SUBJECTIVE:  Including CC & ROS.  No chief complaint on file.   Tierra Divelbiss is a 75 y.o. male that is presenting for a PRP injection.  Review of Systems See HPI   HISTORY: Past Medical, Surgical, Social, and Family History Reviewed & Updated per EMR.   Pertinent Historical Findings include:  Past Medical History:  Diagnosis Date  . CKD (chronic kidney disease), stage II    a. Cr 1.3 during 09/2015 adm, chronicity unclear at that time; f/u value 1.18.  . Diastolic dysfunction without heart failure   . Enlarged prostate   . Essential hypertension   . Fatty liver   . Hyperlipidemia   . Hypertensive heart disease   . Hypokalemia     Past Surgical History:  Procedure Laterality Date  . HERNIA REPAIR     Inguinal     Family History  Problem Relation Age of Onset  . Stroke Mother 48  . Pneumonia Mother     Social History   Socioeconomic History  . Marital status: Married    Spouse name: Not on file  . Number of children: 2  . Years of education: Not on file  . Highest education level: Not on file  Occupational History  . Occupation: Electronics engineer  Tobacco Use  . Smoking status: Never Smoker  . Smokeless tobacco: Never Used  Substance and Sexual Activity  . Alcohol use: Yes    Alcohol/week: 2.0 standard drinks    Types: 2 Standard drinks or equivalent per week    Comment: weekends  . Drug use: No  . Sexual activity: Not on file  Other Topics Concern  . Not on file  Social History Narrative   Lives at home with wife.     Social Determinants of Health   Financial Resource Strain: Not on file  Food Insecurity: Not on file  Transportation Needs: Not on file  Physical Activity: Not on file  Stress: Not on file  Social Connections: Not on file  Intimate Partner Violence: Not on file     PHYSICAL EXAM:  VS: BP (!) 146/74 (BP Location: Left Arm, Patient Position: Sitting, Cuff Size:  Large)   Ht 5\' 8"  (1.727 m)   Wt 179 lb (81.2 kg)   BMI 27.22 kg/m  Physical Exam Gen: NAD, alert, cooperative with exam, well-appearing    Aspiration/Injection Procedure Note Kenshawn Maciolek 1946-02-10  Procedure: Aspiration and Injection Indications: right knee pain   Procedure Details Consent: Risks of procedure as well as the alternatives and risks of each were explained to the (patient/caregiver).  Consent for procedure obtained. Time Out: Verified patient identification, verified procedure, site/side was marked, verified correct patient position, special equipment/implants available, medications/allergies/relevent history reviewed, required imaging and test results available.  Performed.  The area was cleaned with iodine and alcohol swabs.    The right knee superior lateral suprapatellar pouch was injected using 4 cc's of 1% lidocaine with a 25 1 1/2" needle.  An 18-gauge 1-1/2 inch needle was used to achieve aspiration.  The syringe was switched and a 6 mL of PRP was injected. Ultrasound was used. Images were obtained in  Long views showing the injection.    Amount of Fluid Aspirated: 48mL Character of Fluid: clear and straw colored Fluid was sent for:n/a A sterile dressing was applied.  Patient did tolerate procedure well.     ASSESSMENT & PLAN:   Acute meniscal tear  of knee, right, subsequent encounter Occurring after an injury at work.  - PRP injection today  - counseled on after care - f/u in 4 weeks. - provided work note.

## 2020-04-18 NOTE — Assessment & Plan Note (Signed)
Occurring after an injury at work.  - PRP injection today  - counseled on after care - f/u in 4 weeks. - provided work note.

## 2020-04-18 NOTE — Patient Instructions (Signed)
Good to see you Please use ice if needed  Please use tylenol if needed  DO NOTE use ibuprofen or advil   Please send me a message in MyChart with any questions or updates.  Please see me back in 4 weeks.   --Dr. Jordan Likes  Please use the crutches to be partial weight bearing for 1 week.  The crutches can be removed for the 2nd week with only light walking.  The 3rd and 4th week can include light exercise and light cardio The 5th and 7 th week include moderate exercise and nothing high velocity or impact.  The 8th week and on can return to normal exercise

## 2020-05-16 ENCOUNTER — Encounter: Payer: Self-pay | Admitting: Family Medicine

## 2020-05-16 ENCOUNTER — Other Ambulatory Visit: Payer: Self-pay

## 2020-05-16 ENCOUNTER — Ambulatory Visit (INDEPENDENT_AMBULATORY_CARE_PROVIDER_SITE_OTHER): Payer: Worker's Compensation | Admitting: Family Medicine

## 2020-05-16 ENCOUNTER — Ambulatory Visit: Payer: Medicare Other | Admitting: Family Medicine

## 2020-05-16 DIAGNOSIS — S83206D Unspecified tear of unspecified meniscus, current injury, right knee, subsequent encounter: Secondary | ICD-10-CM

## 2020-05-16 NOTE — Progress Notes (Signed)
  Joshua Solis - 75 y.o. male MRN [id number]  Date of birth: December 19, 1945  SUBJECTIVE:  Including CC & ROS.  No chief complaint on file.   Joshua Solis is a 75 y.o. male that is following up for his right knee pain after an injury sustained at work.  We did a PRP injection about 4 weeks ago.  He feels some locking in his knee as well as pain upon ambulation.  Has been using his brace.   Review of Systems See HPI   HISTORY: Past Medical, Surgical, Social, and Family History Reviewed & Updated per EMR.   Pertinent Historical Findings include:  Past Medical History:  Diagnosis Date  . CKD (chronic kidney disease), stage II    a. Cr 1.3 during 09/2015 adm, chronicity unclear at that time; f/u value 1.18.  . Diastolic dysfunction without heart failure   . Enlarged prostate   . Essential hypertension   . Fatty liver   . Hyperlipidemia   . Hypertensive heart disease   . Hypokalemia     Past Surgical History:  Procedure Laterality Date  . HERNIA REPAIR     Inguinal     Family History  Problem Relation Age of Onset  . Stroke Mother 18  . Pneumonia Mother     Social History   Socioeconomic History  . Marital status: Married    Spouse name: Not on file  . Number of children: 2  . Years of education: Not on file  . Highest education level: Not on file  Occupational History  . Occupation: Electronics engineer  Tobacco Use  . Smoking status: Never Smoker  . Smokeless tobacco: Never Used  Substance and Sexual Activity  . Alcohol use: Yes    Alcohol/week: 2.0 standard drinks    Types: 2 Standard drinks or equivalent per week    Comment: weekends  . Drug use: No  . Sexual activity: Not on file  Other Topics Concern  . Not on file  Social History Narrative   Lives at home with wife.     Social Determinants of Health   Financial Resource Strain: Not on file  Food Insecurity: Not on file  Transportation Needs: Not on file  Physical Activity: Not on file  Stress: Not on file   Social Connections: Not on file  Intimate Partner Violence: Not on file     PHYSICAL EXAM:  VS: BP 140/72 (BP Location: Left Arm, Patient Position: Sitting, Cuff Size: Large)   Ht 5\' 8"  (1.727 m)   Wt 179 lb (81.2 kg)   BMI 27.22 kg/m  Physical Exam Gen: NAD, alert, cooperative with exam, well-appearing MSK:  Right knee: No obvious effusion. Normal range of motion. Normal strength resistance. Neurovascular intact   ASSESSMENT & PLAN:   Acute meniscal tear of knee, right, subsequent encounter Having ongoing pain from his injury at work.  Injury occurred on 5/19.  Tried PRP injection about 4 weeks ago.  Still in the window of possible improvement. -Counseled on home exercise therapy and supportive care. -Referral to orthopedic surgery.  This is in place as next step if he does not get improvement with the PRP injection.

## 2020-05-16 NOTE — Patient Instructions (Signed)
Good to see you We have placed the referral to the surgeon to get their opinion.   Please send me a message in MyChart with any questions or updates.  Please see me back in 4 weeks.   --Dr. Jordan Likes

## 2020-05-16 NOTE — Assessment & Plan Note (Addendum)
Having ongoing pain from his injury at work.  Injury occurred on 5/19.  Tried PRP injection about 4 weeks ago.  Still in the window of possible improvement. -Counseled on home exercise therapy and supportive care. -Referral to orthopedic surgery.  This is in place as next step if he does not get improvement with the PRP injection.

## 2020-05-17 ENCOUNTER — Ambulatory Visit: Payer: Medicare Other | Admitting: Family Medicine

## 2020-05-23 ENCOUNTER — Encounter: Payer: Self-pay | Admitting: *Deleted

## 2020-05-23 NOTE — Progress Notes (Signed)
Per Jennette Dubin, RN, case worker for this patient, patient has appt with Dr Thamas Jaegers with [facility] at [phone] on 3.29.22. Lori's contact is [phone] and her email is [email or web address].

## 2020-06-29 ENCOUNTER — Encounter (HOSPITAL_BASED_OUTPATIENT_CLINIC_OR_DEPARTMENT_OTHER): Payer: Self-pay

## 2020-06-29 ENCOUNTER — Emergency Department (HOSPITAL_BASED_OUTPATIENT_CLINIC_OR_DEPARTMENT_OTHER)
Admission: EM | Admit: 2020-06-29 | Discharge: 2020-06-29 | Disposition: A | Discharge status: Home / Self Care | Payer: No Typology Code available for payment source | Attending: Emergency Medicine | Admitting: Emergency Medicine

## 2020-06-29 ENCOUNTER — Emergency Department (HOSPITAL_BASED_OUTPATIENT_CLINIC_OR_DEPARTMENT_OTHER): Payer: No Typology Code available for payment source

## 2020-06-29 ENCOUNTER — Other Ambulatory Visit: Payer: Self-pay

## 2020-06-29 DIAGNOSIS — I129 Hypertensive chronic kidney disease with stage 1 through stage 4 chronic kidney disease, or unspecified chronic kidney disease: Secondary | ICD-10-CM | POA: Diagnosis not present

## 2020-06-29 DIAGNOSIS — R1011 Right upper quadrant pain: Secondary | ICD-10-CM | POA: Diagnosis present

## 2020-06-29 DIAGNOSIS — K439 Ventral hernia without obstruction or gangrene: Secondary | ICD-10-CM | POA: Diagnosis not present

## 2020-06-29 DIAGNOSIS — Z7982 Long term (current) use of aspirin: Secondary | ICD-10-CM | POA: Insufficient documentation

## 2020-06-29 DIAGNOSIS — Z79899 Other long term (current) drug therapy: Secondary | ICD-10-CM | POA: Insufficient documentation

## 2020-06-29 DIAGNOSIS — N182 Chronic kidney disease, stage 2 (mild): Secondary | ICD-10-CM | POA: Insufficient documentation

## 2020-06-29 DIAGNOSIS — R109 Unspecified abdominal pain: Secondary | ICD-10-CM

## 2020-06-29 LAB — COMPREHENSIVE METABOLIC PANEL
ALT: 23 U/L (ref 0–44)
AST: 22 U/L (ref 15–41)
Albumin: 3.9 g/dL (ref 3.5–5.0)
Alkaline Phosphatase: 56 U/L (ref 38–126)
Anion gap: 10 (ref 5–15)
BUN: 18 mg/dL (ref 8–23)
CO2: 21 mmol/L — ABNORMAL LOW (ref 22–32)
Calcium: 9.1 mg/dL (ref 8.9–10.3)
Chloride: 104 mmol/L (ref 98–111)
Creatinine, Ser: 1.1 mg/dL (ref 0.61–1.24)
GFR, Estimated: >60 mL/min (ref >60)
Glucose, Bld: 102 mg/dL — ABNORMAL HIGH (ref 70–99)
Potassium: 4.4 mmol/L (ref 3.5–5.1)
Sodium: 135 mmol/L (ref 135–145)
Total Bilirubin: 0.8 mg/dL (ref 0.3–1.2)
Total Protein: 7.3 g/dL (ref 6.5–8.1)

## 2020-06-29 LAB — CBC WITH DIFFERENTIAL/PLATELET
Abs Immature Granulocytes: 0.02 10*3/uL (ref 0.00–0.07)
Basophils Absolute: 0 10*3/uL (ref 0.0–0.1)
Basophils Relative: 1 %
Eosinophils Absolute: 0.1 10*3/uL (ref 0.0–0.5)
Eosinophils Relative: 1 %
HCT: 41.3 % (ref 39.0–52.0)
Hemoglobin: 14.2 g/dL (ref 13.0–17.0)
Immature Granulocytes: 1 %
Lymphocytes Relative: 38 %
Lymphs Abs: 1.7 10*3/uL (ref 0.7–4.0)
MCH: 32.4 pg (ref 26.0–34.0)
MCHC: 34.4 g/dL (ref 30.0–36.0)
MCV: 94.3 fL (ref 80.0–100.0)
Monocytes Absolute: 0.5 10*3/uL (ref 0.1–1.0)
Monocytes Relative: 12 %
Neutro Abs: 2.1 10*3/uL (ref 1.7–7.7)
Neutrophils Relative %: 47 %
Platelets: 238 10*3/uL (ref 150–400)
RBC: 4.38 MIL/uL (ref 4.22–5.81)
RDW: 13.2 % (ref 11.5–15.5)
WBC: 4.4 10*3/uL (ref 4.0–10.5)
nRBC: 0 % (ref 0.0–0.2)

## 2020-06-29 LAB — URINALYSIS, ROUTINE W REFLEX MICROSCOPIC
Bilirubin Urine: NEGATIVE
Glucose, UA: NEGATIVE mg/dL
Hgb urine dipstick: NEGATIVE
Ketones, ur: NEGATIVE mg/dL
Leukocytes,Ua: NEGATIVE
Nitrite: NEGATIVE
Protein, ur: NEGATIVE mg/dL
Specific Gravity, Urine: 1.025 (ref 1.005–1.030)
pH: 5.5 (ref 5.0–8.0)

## 2020-06-29 LAB — LACTIC ACID, PLASMA: Lactic Acid, Venous: 0.8 mmol/L (ref 0.5–1.9)

## 2020-06-29 MED ORDER — IOHEXOL 300 MG/ML  SOLN
100.0000 mL | Freq: Once | INTRAMUSCULAR | Status: AC | PRN
  Administered 2020-06-29: 100 mL via INTRAVENOUS

## 2020-06-29 MED ORDER — FENTANYL CITRATE (PF) 100 MCG/2ML IJ SOLN
50.0000 ug | Freq: Once | INTRAMUSCULAR | Status: DC
  Filled 2020-06-29: qty 2

## 2020-06-29 NOTE — Discharge Instructions (Signed)
Your work-up today was overall reassuring aside from a small hernia near your bellybutton we discovered.  We were able to reduce it with gentle pressure today.  Given your otherwise reassuring exam, we do feel you are safe for discharge home however please follow-up with general surgery to discuss further outpatient management of this hernia that likely caused her discomfort today.  Please rest and stay hydrated.  If any symptoms change or worsen acutely as we discussed, please return to the nearest emergency department.

## 2020-06-29 NOTE — ED Provider Notes (Signed)
[facility] EMERGENCY DEPARTMENT Provider Note   CSN: [id number] Arrival date & time: 06/29/20  1055     History Chief Complaint  Patient presents with  . Flank Pain    Kary KosCharles Halberg is a 75 y.o. male.   The history is provided by the patient and medical records. No language interpreter was used.  Abdominal Pain Pain location:  RUQ, RLQ and R flank Pain quality: sharp   Pain radiates to:  R flank and back Pain severity:  Severe Onset quality:  Gradual Duration:  1 week Timing:  Intermittent Progression:  Waxing and waning Chronicity:  New Context: not previous surgeries, not recent illness and not trauma   Relieved by:  Nothing Worsened by:  Nothing Ineffective treatments:  None tried Associated symptoms: no chest pain, no chills, no constipation, no cough, no diarrhea, no dysuria, no fatigue, no fever, no flatus, no nausea, no shortness of breath and no vomiting        Past Medical History:  Diagnosis Date  . CKD (chronic kidney disease), stage II    a. Cr 1.3 during 09/2015 adm, chronicity unclear at that time; f/u value 1.18.  . Diastolic dysfunction without heart failure   . Enlarged prostate   . Essential hypertension   . Fatty liver   . Hyperlipidemia   . Hypertensive heart disease   . Hypokalemia     Patient Active Problem List   Diagnosis Date Noted  . LVH (left ventricular hypertrophy) 03/28/2020  . Acute meniscal tear of knee, right, subsequent encounter 07/30/2019  . Abnormal stress test 08/02/2016  . Hyperlipidemia 08/02/2016  . Chest pain 08/17/2015  . Essential hypertension 08/17/2015  . Renal insufficiency 08/17/2015  . Hypokalemia 08/17/2015    Past Surgical History:  Procedure Laterality Date  . HERNIA REPAIR     Inguinal        Family History  Problem Relation Age of Onset  . Stroke Mother [id number]  . Pneumonia Mother     Social History   Tobacco Use  . Smoking status: Never Smoker  . Smokeless tobacco: Never Used   Substance Use Topics  . Alcohol use: Yes    Alcohol/week: 2.0 standard drinks    Types: 2 Standard drinks or equivalent per week    Comment: weekends  . Drug use: No    Home Medications Prior to Admission medications   Medication Sig Start Date End Date Taking? Authorizing Provider  amLODipine (NORVASC) 10 MG tablet Take 1 tablet (10 mg total) by mouth daily. 10/07/19   Rollene RotundaHochrein, James, MD  aspirin (ASPIRIN LOW DOSE) 81 MG EC tablet TAKE 1 TABLET(81 MG) BY MOUTH DAILY 10/07/19   Rollene RotundaHochrein, James, MD  atorvastatin (LIPITOR) 80 MG tablet Take 1 tablet (80 mg total) by mouth at bedtime. 03/30/20   Rollene RotundaHochrein, James, MD  calcium carbonate (TUMS - DOSED IN MG ELEMENTAL CALCIUM) 500 MG chewable tablet Chew 1 tablet by mouth daily.    [provider]  cetirizine (ZYRTEC) 10 MG tablet Take 10 mg by mouth daily.    [provider]  ibuprofen (ADVIL,MOTRIN) 200 MG tablet Take 400 mg by mouth every 6 (six) hours as needed for moderate pain.    [provider]  naproxen (NAPROSYN) 500 MG tablet Take 1 tablet (500 mg total) by mouth 2 (two) times daily as needed. 07/30/19 07/29/20  Myra RudeSchmitz, Jeremy E, MD  nitroGLYCERIN (NITROSTAT) 0.4 MG SL tablet Place 0.4 mg under the tongue every 5 (five) minutes as needed  for chest pain (x 3 doses).    [provider]  omeprazole (PRILOSEC) 20 MG capsule Take 1 capsule (20 mg total) by mouth daily. 08/30/19   Aziyah Provencal, Canary Brim, MD  ondansetron (ZOFRAN) 4 MG tablet Take 1 tablet (4 mg total) by mouth every 8 (eight) hours as needed for nausea or vomiting. 08/30/19   Simuel Stebner, Canary Brim, MD  sertraline (ZOLOFT) 100 MG tablet Take 100 mg by mouth at bedtime.    [provider]  sucralfate (CARAFATE) 1 g tablet Take 1 tablet (1 g total) by mouth 4 (four) times daily -  with meals and at bedtime. 08/30/19   Joei Frangos, Canary Brim, MD  tamsulosin (FLOMAX) 0.4 MG CAPS capsule Take 0.4 mg by mouth daily.     [provider]   terazosin (HYTRIN) 10 MG capsule Take 10 mg by mouth at bedtime.    [provider]    Allergies    Patient has no known allergies.  Review of Systems   Review of Systems  Constitutional: Negative for chills, diaphoresis, fatigue and fever.  HENT: Negative for congestion.   Eyes: Negative for visual disturbance.  Respiratory: Negative for cough, chest tightness, shortness of breath and wheezing.   Cardiovascular: Negative for chest pain, palpitations and leg swelling.  Gastrointestinal: Positive for abdominal pain. Negative for constipation, diarrhea, flatus, nausea and vomiting.  Genitourinary: Positive for flank pain. Negative for dysuria and frequency.  Musculoskeletal: Positive for back pain. Negative for neck pain and neck stiffness.  Neurological: Negative for light-headedness and headaches.  Psychiatric/Behavioral: Negative for agitation.  All other systems reviewed and are negative.   Physical Exam Updated Vital Signs BP (!) 160/71 (BP Location: Right Arm)   Pulse (!) 57   Temp 98.2 F (36.8 C) (Oral)   Resp 18   Ht 5\' 8"  (1.727 m)   Wt 83.5 kg   SpO2 99%   BMI 27.98 kg/m   Physical Exam Vitals and nursing note reviewed.  Constitutional:      General: He is not in acute distress.    Appearance: He is well-developed. He is not ill-appearing, toxic-appearing or diaphoretic.  HENT:     Head: Normocephalic and atraumatic.     Nose: Nose normal. No congestion or rhinorrhea.     Mouth/Throat:     Mouth: Mucous membranes are moist.     Pharynx: No oropharyngeal exudate or posterior oropharyngeal erythema.  Eyes:     Extraocular Movements: Extraocular movements intact.     Pupils: Pupils are equal, round, and reactive to light.  Cardiovascular:     Rate and Rhythm: Normal rate and regular rhythm.     Pulses: Normal pulses.     Heart sounds: No murmur heard.   Pulmonary:     Effort: Pulmonary effort is normal. No respiratory distress.     Breath  sounds: Normal breath sounds.  Abdominal:     General: Abdomen is flat. Bowel sounds are normal.     Palpations: Abdomen is soft.     Tenderness: There is abdominal tenderness in the right upper quadrant, right lower quadrant and epigastric area. There is right CVA tenderness. There is no left CVA tenderness, guarding or rebound.    Musculoskeletal:        General: Tenderness present.     Cervical back: Neck supple. No tenderness.       Back:     Right lower leg: No edema.     Left lower leg: No edema.  Skin:    General: Skin is warm and dry.     Capillary Refill: Capillary refill takes less than 2 seconds.     Findings: No erythema.  Neurological:     General: No focal deficit present.     Mental Status: He is alert.  Psychiatric:        Mood and Affect: Mood normal.     ED Results / Procedures / Treatments   Labs (all labs ordered are listed, but only abnormal results are displayed) Labs Reviewed  URINALYSIS, ROUTINE W REFLEX MICROSCOPIC - Abnormal; Notable for the following components:      Result Value   Color, Urine AMBER (*)    All other components within normal limits  COMPREHENSIVE METABOLIC PANEL - Abnormal; Notable for the following components:   CO2 21 (*)    Glucose, Bld 102 (*)    All other components within normal limits  URINE CULTURE  CBC WITH DIFFERENTIAL/PLATELET  LACTIC ACID, PLASMA  LACTIC ACID, PLASMA    EKG None  Radiology CT ABDOMEN PELVIS W CONTRAST  Result Date: 06/29/2020 CLINICAL DATA:  Right-sided abdominal pain radiating to the right flank EXAM: CT ABDOMEN AND PELVIS WITH CONTRAST TECHNIQUE: Multidetector CT imaging of the abdomen and pelvis was performed using the standard protocol following bolus administration of intravenous contrast. CONTRAST:  OMNIPAQUE IOHEXOL 300 MG/ML  SOLN COMPARISON:  CT January 13, 2017 FINDINGS: Lower chest: No acute abnormality. Hepatobiliary: Bilobar hypodense hepatic lesions measuring up to 1.4 cm in  the left lobe of the liver on image 13/2 consistent with hepatic cysts. No solid enhancing hepatic lesions. Gallbladder is grossly unremarkable. No biliary ductal dilation. Pancreas: Unremarkable. No pancreatic ductal dilatation or surrounding inflammatory changes. Spleen: Normal in size without focal abnormality. Adrenals/Urinary Tract: The bilateral adrenal glands are unremarkable. Similar appearance of the small bilateral renal cysts measuring up to 1.3 cm in the left interpolar region. No solid enhancing renal lesions. No hydronephrosis. Bilateral renal scarring. Urinary bladder is decompressed limiting evaluation. Stomach/Bowel: Stomach is grossly unremarkable for degree of distension. No suspicious small bowel wall thickening or dilation. Normal appendix. Extensive right and left-sided colonic diverticulosis without findings of acute diverticulitis. Vascular/Lymphatic: Aortic atherosclerosis. No enlarged abdominal or pelvic lymph nodes. Reproductive: Prostatomegaly Other: Small ventral hernia containing fat and nonobstructed loop of small bowel. Musculoskeletal: Multilevel degenerative changes spine. No acute osseous abnormality. IMPRESSION: 1. No acute abdominopelvic findings. 2. Extensive right and left-sided colonic diverticulosis without findings of acute diverticulitis. 3. Small ventral hernia containing fat and nonobstructed loop of small bowel. 4. Prostatomegaly. 5. Aortic atherosclerosis. Aortic Atherosclerosis (ICD10-I70.0). Electronically Signed   By: Maudry Mayhew MD   On: 06/29/2020 15:13    Procedures Procedures   Medications Ordered in ED Medications  fentaNYL (SUBLIMAZE) injection 50 mcg (0 mcg Intravenous Hold 06/29/20 1427)  iohexol (OMNIPAQUE) 300 MG/ML solution 100 mL (100 mLs Intravenous Contrast Given 06/29/20 1436)    ED Course  I have reviewed the triage vital signs and the nursing notes.  Pertinent labs & imaging results that were available during my care of the patient  were reviewed by me and considered in my medical decision making (see chart for details).    MDM Rules/Calculators/A&P                          Deniz Hannan is a 75 y.o. male with a past medical history significant for hyperlipidemia, hypertension, kidney disease, and prostate enlargement who presents  with 1 week of intermittent right flank and abdominal pain.  Patient reports that he has had sharp pain intermittently in his right back and flank and abdomen for the last week.  He reports it is sharp.  He reports today it has come and gone but was more severe.  He describes it 8 of 10 severity currently.  He denies fevers, chills, nausea, vomiting.  Denies any constipation or diarrhea.  Denies any urinary changes.  He denies any trauma.  Reports a strong family history of kidney stones but denies any history personally.  He reports no recent medication changes or other abnormalities.  Denies any groin or testicle or scrotum pain.  On exam, lungs clear and chest nontender.  Abdomen is tender in the right upper quadrant and right mid abdomen.  Patient also has some flank tenderness and CVA tenderness on the right.  No midline back tenderness.  Lungs clear.  Normal bowel sounds.  No murmur.  Good pulses in extremities.  Given patient's right-sided abdominal pain, I do feel he needs imaging to rule out diverticulitis versus appendicitis versus kidney stone.  Primarily I suspect is a stone however given the right upper quadrant tenderness, I do feel a contrasted study would be more beneficial for him.  We will get screening labs.  He does want some pain medicines will give some fentanyl.  Denies nausea now.  Urinalysis was performed in triage and does not show blood so lower suspicion for kidney stone.  No evidence of infection at that time.  No rash to suggest shingles.  Anticipate reassessment after work-up to determine disposition.   CT scan shows no evidence of acute diverticulitis, cholecystitis, or  appendicitis but does show a small ventral hernia with some bowel present.  I reassessed the patient and he does indeed have a palpable hernia near his umbilicus.  Gentle pressure was applied and it reduced.  Patient continues to be pain-free.  Patient will be instructed to follow-up with general surgery as I suspect the intermittent discomfort in his abdomen is related to his hernia.  He has had hernia surgery in the past and agrees.  Given his reassuring exam otherwise we feel he is safe for discharge home.  He agreed with plan of care and was given instructions on outpatient hernia management.  He no questions or concerns and was discharged in good condition.  Final Clinical Impression(s) / ED Diagnoses Final diagnoses:  Right flank pain  Right sided abdominal pain  Ventral hernia without obstruction or gangrene    Clinical Impression: 1. Right flank pain   2. Right sided abdominal pain   3. Ventral hernia without obstruction or gangrene     Disposition: Discharge  Condition: Good  I have discussed the results, Dx and Tx plan with the pt(& family if present). He/she/they expressed understanding and agree(s) with the plan. Discharge instructions discussed at great length. Strict return precautions discussed and pt &/or family have verbalized understanding of the instructions. No further questions at time of discharge.    New Prescriptions   No medications on file    Follow Up: Surgery, [facility] [address] [place] [address] [phone]        Briea Mcenery, Canary Brim, MD 06/29/20 1539

## 2020-06-29 NOTE — ED Triage Notes (Signed)
Pt reports RIGHT plank pain intermittently for a week but came on strong this morning. Denies nausea, vomiting, dysuria

## 2020-06-29 NOTE — ED Notes (Signed)
Patient transported to CT 

## 2020-07-01 LAB — URINE CULTURE: Culture: NO GROWTH

## 2020-08-11 HISTORY — PX: KNEE ARTHROSCOPY W/ MENISCAL REPAIR: SHX1877

## 2020-11-13 ENCOUNTER — Telehealth: Payer: Self-pay

## 2020-11-13 NOTE — Telephone Encounter (Signed)
Left VM

## 2020-11-13 NOTE — Telephone Encounter (Signed)
   [facility] Pre-operative Risk Assessment    Patient Name: Joshua Solis  DOB: 20-Apr-1945 MRN: [id number]  HEARTCARE STAFF:  - IMPORTANT!!!!!! Under Visit Info/Reason for Call, type in Other and utilize the format Clearance MM/DD/YY or Clearance TBD. Do not use dashes or single digits. - Please review there is not already an duplicate clearance open for this procedure. - If request is for dental extraction, please clarify the # of teeth to be extracted. - If the patient is currently at the dentist's office, call Pre-Op Callback Staff (MA/nurse) to input urgent request.  - If the patient is not currently in the dentist office, please route to the Pre-Op pool.  Request for surgical clearance:  What type of surgery is being performed? Umbilical Hernia Repair  When is this surgery scheduled? TBD  What type of clearance is required (medical clearance vs. Pharmacy clearance to hold med vs. Both)? Cardiac and pharmacy   Are there any medications that need to be held prior to surgery and how long? Asprin and Nitrostat   Practice name and name of physician performing surgery? [facility] [place] [facility] Dr. Louanna Raw  What is the office phone number? [phone]   7.   What is the office fax number? [phone]  8.   Anesthesia type (None, local, MAC, general) ? General    Zebedee Iba 11/13/2020, 11:55 AM  _________________________________________________________________   (provider comments below)

## 2020-11-16 NOTE — Telephone Encounter (Signed)
Pt is returning call.  

## 2020-11-20 NOTE — Telephone Encounter (Signed)
Pt has been scheduled to see Dr. Antoine Poche 11/21/20 @ 2:40 for pre op clearance. Pt thanked me for the call anh the help.  Will forward notes to MD for appt. Will send FYI to surgeon's office pt has appt 11/21/20.

## 2020-11-20 NOTE — Telephone Encounter (Signed)
Primary Cardiologist:James Hochrein, MD  Chart reviewed as part of pre-operative protocol coverage. Because of Joshua Solis's past medical history and time since last visit, he/she will require a follow-up visit in order to better assess preoperative cardiovascular risk.  Pre-op covering staff: - Please schedule appointment and call patient to inform them. - Please contact requesting surgeon's office via preferred method (i.e, phone, fax) to inform them of need for appointment prior to surgery.  If applicable, this message will also be routed to pharmacy pool and/or primary cardiologist for input on holding anticoagulant/antiplatelet agent as requested below so that this information is available at time of patient's appointment.   Ronney Asters, NP  11/20/2020, 9:56 AM

## 2020-11-20 NOTE — Progress Notes (Signed)
Cardiology Office Note   Date:  11/21/2020   ID:  Joshua Solis, DOB March 12, 1945, MRN [id number]  PCP:  [facility]  Cardiologist:   Rollene Rotunda, MD  CC:  LVH on ECHO    History of Present Illness: Joshua Solis is a 75 y.o. male who presents for who presents for follow-up of chest pain. He went to the [place] and had a stress perfusion study which demonstrated a small reversible inferior defect. The EF was well-preserved. He was referred to the [place] to have cardiac catheterization. However, when he went to have that done he couldn't find parking so we didn't have the procedure. I saw him for the first and only time in 2018.  In 2017 he had a hypertensive response to on POET (Plain Old Exercise Treadmill) but no clear evidence of ischemia in 2017.   At that last visit he had moderate LVH on echo in 2020.   Since I last saw him he has done well. The patient denies any new symptoms such as chest discomfort, neck or arm discomfort. There has been no new shortness of breath, PND or orthopnea. There have been no reported palpitations, presyncope or syncope.  No retired from [employer] just in the past 4 weeks.  He is a Photographer.    Past Medical History:  Diagnosis Date   CKD (chronic kidney disease), stage II    a. Cr 1.3 during 09/2015 adm, chronicity unclear at that time; f/u value 1.18.   Diastolic dysfunction without heart failure    Enlarged prostate    Essential hypertension    Fatty liver    Hyperlipidemia    Hypertensive heart disease    Hypokalemia     Past Surgical History:  Procedure Laterality Date   HERNIA REPAIR     Inguinal    KNEE SURGERY     Arthroscope     Current Outpatient Medications  Medication Sig Dispense Refill   amLODipine (NORVASC) 10 MG tablet Take 1 tablet (10 mg total) by mouth daily. 90 tablet 3   aspirin (ASPIRIN LOW DOSE) 81 MG EC tablet TAKE 1 TABLET(81 MG) BY MOUTH DAILY 90 tablet 3   atorvastatin (LIPITOR) 80 MG tablet  Take 1 tablet (80 mg total) by mouth at bedtime. 90 tablet 3   calcium carbonate (TUMS - DOSED IN MG ELEMENTAL CALCIUM) 500 MG chewable tablet Chew 1 tablet by mouth daily.     cetirizine (ZYRTEC) 10 MG tablet Take 10 mg by mouth daily.     ibuprofen (ADVIL,MOTRIN) 200 MG tablet Take 400 mg by mouth every 6 (six) hours as needed for moderate pain.     nitroGLYCERIN (NITROSTAT) 0.4 MG SL tablet Place 0.4 mg under the tongue every 5 (five) minutes as needed for chest pain (x 3 doses).     omeprazole (PRILOSEC) 20 MG capsule Take 1 capsule (20 mg total) by mouth daily. 30 capsule 0   ondansetron (ZOFRAN) 4 MG tablet Take 1 tablet (4 mg total) by mouth every 8 (eight) hours as needed for nausea or vomiting. 12 tablet 0   sertraline (ZOLOFT) 100 MG tablet Take 100 mg by mouth at bedtime.     sucralfate (CARAFATE) 1 g tablet Take 1 tablet (1 g total) by mouth 4 (four) times daily -  with meals and at bedtime. 30 tablet 0   tamsulosin (FLOMAX) 0.4 MG CAPS capsule Take 0.4 mg by mouth daily.      terazosin (HYTRIN) 10  MG capsule Take 10 mg by mouth at bedtime.     No current facility-administered medications for this visit.    Allergies:   Patient has no known allergies.    ROS:  Please see the history of present illness.   Otherwise, review of systems are positive for none.   All other systems are reviewed and negative.    PHYSICAL EXAM: VS:  BP (!) 146/82   Pulse 63   Resp (!) 97   Ht 5\' 8"  (1.727 m)   Wt 187 lb 6.4 oz (85 kg)   BMI 28.49 kg/m  , BMI Body mass index is 28.49 kg/m. GENERAL:  Well appearing NECK:  No jugular venous distention, waveform within normal limits, carotid upstroke brisk and symmetric, no bruits, no thyromegaly LUNGS:  Clear to auscultation bilaterally CHEST:  Unremarkable HEART:  PMI not displaced or sustained,S1 and S2 within normal limits, no S3, no S4, no clicks, no rubs, no murmurs ABD:  Flat, positive bowel sounds normal in frequency in pitch, no bruits, no  rebound, no guarding, no midline pulsatile mass, no hepatomegaly, no splenomegaly EXT:  2 plus pulses throughout, no edema, no cyanosis no clubbing   EKG:  EKG is  ordered today. The ekg ordered today demonstrates sinus rhythm, rate 63, axis within normal limits, intervals within normal limits, inferior T wave inversions consistent with repolarization changes.  No change from previous   Recent Labs: 06/29/2020: ALT 23; BUN 18; Creatinine, Ser 1.10; Hemoglobin 14.2; Platelets 238; Potassium 4.4; Sodium 135    Lipid Panel    Component Value Date/Time   CHOL 189 08/26/2019 0938   TRIG 167 (H) 08/26/2019 0938   HDL 34 (L) 08/26/2019 0938   CHOLHDL 5.6 (H) 08/26/2019 0938   CHOLHDL 5.0 08/18/2015 0228   VLDL 19 08/18/2015 0228   LDLCALC 125 (H) 08/26/2019 0938      Wt Readings from Last 3 Encounters:  11/21/20 187 lb 6.4 oz (85 kg)  06/29/20 184 lb (83.5 kg)  05/16/20 179 lb (81.2 kg)      Other studies Reviewed: Additional studies/ records that were reviewed today include: Labs. Review of the above records demonstrates:  Please see elsewhere in the note.     ASSESSMENT AND PLAN:  LVH:     I am going to check an echocardiogram.  I think this is related to his hypertension.  If however he has progressed with what appears to be relatively controlled blood pressures then I might consider PYP screening.   HTN:   His blood pressure is typically at target at home.  He meds as listed.   DYSLIPIDEMIA:  LDL was   PREOP: He needs to have inguinal hernia repair.  Patient is very high functional level.  He has no symptoms.  He is not going for high risk procedure.  Therefore, according to ACC/AHA guidelines no further therapy is indicated.  Current medicines are reviewed at length with the patient today.  The patient does not have concerns regarding medicines.  The following changes have been made: None  Labs/ tests ordered today include:   Orders Placed This Encounter  Procedures    EKG 12-Lead   ECHOCARDIOGRAM COMPLETE      Disposition:   FU with me in one  year.    Signed, 05/18/20, MD  11/21/2020 3:30 PM    [facility]

## 2020-11-21 ENCOUNTER — Other Ambulatory Visit: Payer: Self-pay

## 2020-11-21 ENCOUNTER — Encounter: Payer: Self-pay | Admitting: Cardiology

## 2020-11-21 ENCOUNTER — Ambulatory Visit (INDEPENDENT_AMBULATORY_CARE_PROVIDER_SITE_OTHER): Payer: Medicare Other | Admitting: Cardiology

## 2020-11-21 VITALS — BP 146/82 | HR 63 | Resp 97 | Ht 68.0 in | Wt 187.4 lb

## 2020-11-21 DIAGNOSIS — I517 Cardiomegaly: Secondary | ICD-10-CM

## 2020-11-21 DIAGNOSIS — I10 Essential (primary) hypertension: Secondary | ICD-10-CM

## 2020-11-21 DIAGNOSIS — E785 Hyperlipidemia, unspecified: Secondary | ICD-10-CM | POA: Diagnosis not present

## 2020-11-21 NOTE — Patient Instructions (Signed)
Medication Instructions:  ?Your physician recommends that you continue on your current medications as directed. Please refer to the Current Medication list given to you today. ? ?*If you need a refill on your cardiac medications before your next appointment, please call your pharmacy* ? ?Testing/Procedures: ?Your physician has requested that you have an echocardiogram. Echocardiography is a painless test that uses sound waves to create images of your heart. It provides your doctor with information about the size and shape of your heart and how well your heart?s chambers and valves are working. This procedure takes approximately one hour. There are no restrictions for this procedure. ?This will be done at our [address] location:  [address] ? ?Follow-Up: ?At [facility], you and your health needs are our priority.  As part of our continuing mission to provide you with exceptional heart care, we have created designated Provider Care Teams.  These Care Teams include your primary Cardiologist (physician) and Advanced Practice Providers (APPs -  Physician Assistants and Nurse Practitioners) who all work together to provide you with the care you need, when you need it. ? ?We recommend signing up for the patient portal called "MyChart".  Sign up information is provided on this After Visit Summary.  MyChart is used to connect with patients for Virtual Visits (Telemedicine).  Patients are able to view lab/test results, encounter notes, upcoming appointments, etc.  Non-urgent messages can be sent to your provider as well.   ?To learn more about what you can do with MyChart, go to [email or web address]://[email or web address].   ? ?Your next appointment:   ?12 month(s) ? ?The format for your next appointment:   ?In Person ? ?Provider:   ?James Hochrein, MD { ? ? ?

## 2020-12-11 ENCOUNTER — Other Ambulatory Visit: Payer: Self-pay

## 2020-12-11 ENCOUNTER — Ambulatory Visit (HOSPITAL_COMMUNITY): Payer: Medicare Other | Attending: Cardiovascular Disease

## 2020-12-11 DIAGNOSIS — I517 Cardiomegaly: Secondary | ICD-10-CM | POA: Insufficient documentation

## 2020-12-11 LAB — ECHOCARDIOGRAM COMPLETE
Area-P 1/2: 3.85 cm2
S' Lateral: 3.2 cm

## 2020-12-22 ENCOUNTER — Other Ambulatory Visit: Payer: Self-pay | Admitting: Cardiology

## 2020-12-22 ENCOUNTER — Telehealth: Payer: Self-pay | Admitting: Cardiology

## 2020-12-22 DIAGNOSIS — Z5181 Encounter for therapeutic drug level monitoring: Secondary | ICD-10-CM

## 2020-12-22 DIAGNOSIS — I10 Essential (primary) hypertension: Secondary | ICD-10-CM

## 2020-12-22 DIAGNOSIS — E785 Hyperlipidemia, unspecified: Secondary | ICD-10-CM

## 2020-12-22 MED ORDER — ATORVASTATIN CALCIUM 80 MG PO TABS
80.0000 mg | ORAL_TABLET | Freq: Every day | ORAL | 3 refills | Status: DC
Start: 2020-12-22 — End: 2021-12-07

## 2020-12-22 NOTE — Telephone Encounter (Signed)
Lm for pt to call back Refill done for the Atorvastatin  not sure if need to do ASA as this is OTC Will await return call from pt re if need to call in script for asa ./cy

## 2020-12-22 NOTE — Telephone Encounter (Signed)
*  STAT* If patient is at the pharmacy, call can be transferred to refill team.   1. Which medications need to be refilled? (please list name of each medication and dose if known)  aspirin (ASPIRIN LOW DOSE) 81 MG EC tablet; atorvastatin (LIPITOR) 80 MG tablet  2. Which pharmacy/location (including street and city if local pharmacy) is medication to be sent to?  [id number] - [place], [place] - [address] AT [facility] [address]  3. Do they need a 30 day or 90 day supply? 90

## 2021-01-12 ENCOUNTER — Ambulatory Visit: Payer: Self-pay | Admitting: Surgery

## 2021-02-02 ENCOUNTER — Encounter (HOSPITAL_BASED_OUTPATIENT_CLINIC_OR_DEPARTMENT_OTHER): Payer: Self-pay | Admitting: Surgery

## 2021-02-02 ENCOUNTER — Other Ambulatory Visit: Payer: Self-pay

## 2021-02-02 NOTE — Progress Notes (Signed)
Spoke w/ via phone for pre-op interview--- pt Lab needs dos----  Mirant results------ current ekg in epic/ chart COVID test -----patient states asymptomatic no test needed Arrive at ------- 0630 on 02-07-2021 NPO after MN NO Solid Food.  Clear liquids from MN until--- 0530 Med rec completed Medications to take morning of surgery ----- norvasc, metoprolol, prilosec Diabetic medication ----- n/a Patient instructed no nail polish to be worn day of surgery Patient instructed to bring photo id and insurance card day of surgery Patient aware to have Driver (ride ) / caregiver  for 24 hours after surgery --daughter, fatuma Ariel Patient Special Instructions ----- n/a Pre-Op special Istructions ----- pt has cardiac office note clearance for this procedure by Dr Antoine Poche dated 11-21-2020 in epic/ chart Patient verbalized understanding of instructions that were given at this phone interview. Patient denies shortness of breath, chest pain, fever, cough at this phone interview.    Anesthesia Review: Hypertensive heart disease without heart failure;  CKD 2;  PTSD;  alcohol abuse Pt denies cardiac s&s, sob, and no peripheral swelling.  PCP:  at the [facility] in [place] Cardiologist : Dr Antoine Poche [facility] 11-21-2020 epic) Chest x-ray : 08-30-2019 care everywhere EKG : 11-21-2020 epic Echo : 12-11-2020 Stress test: ETT 08-18-2015 in epic; previous at [place] not available Cardiac Cath :  no Activity level: denies sob w/ any activity Sleep Study/ CPAP : no  Blood Thinner/ Instructions /Last Dose: no ASA / Instructions/ Last Dose :  ASA 81mg  /  pt stated was not given instructions to stop, last dose will be day before surgery

## 2021-02-06 NOTE — Anesthesia Preprocedure Evaluation (Addendum)
Anesthesia Evaluation  Patient identified by MRN, date of birth, ID band Patient awake    Reviewed: Allergy & Precautions, H&P , NPO status , Patient's Chart, lab work & pertinent test results  Airway Mallampati: III  TM Distance: >3 FB Neck ROM: Full    Dental no notable dental hx. (+) Partial Lower, Partial Upper, Dental Advisory Given   Pulmonary neg pulmonary ROS,    Pulmonary exam normal breath sounds clear to auscultation       Cardiovascular Exercise Tolerance: Good hypertension, Pt. on medications and Pt. on home beta blockers  Rhythm:Regular Rate:Normal     Neuro/Psych Anxiety Depression negative neurological ROS     GI/Hepatic Neg liver ROS, GERD  Medicated,(+)     substance abuse  alcohol use,   Endo/Other  negative endocrine ROS  Renal/GU Renal InsufficiencyRenal disease  negative genitourinary   Musculoskeletal  (+) Arthritis , Osteoarthritis,    Abdominal   Peds  Hematology negative hematology ROS (+)   Anesthesia Other Findings   Reproductive/Obstetrics negative OB ROS                            Anesthesia Physical Anesthesia Plan  ASA: 3  Anesthesia Plan: General   Post-op Pain Management: Tylenol PO (pre-op)   Induction: Intravenous  PONV Risk Score and Plan: 3 and Ondansetron, Dexamethasone and Midazolam  Airway Management Planned: Oral ETT  Additional Equipment:   Intra-op Plan:   Post-operative Plan: Extubation in OR  Informed Consent: I have reviewed the patients History and Physical, chart, labs and discussed the procedure including the risks, benefits and alternatives for the proposed anesthesia with the patient or authorized representative who has indicated his/her understanding and acceptance.     Dental advisory given  Plan Discussed with: CRNA  Anesthesia Plan Comments:        Anesthesia Quick Evaluation

## 2021-02-07 ENCOUNTER — Ambulatory Visit (HOSPITAL_BASED_OUTPATIENT_CLINIC_OR_DEPARTMENT_OTHER): Payer: Medicare Other | Admitting: Anesthesiology

## 2021-02-07 ENCOUNTER — Encounter (HOSPITAL_BASED_OUTPATIENT_CLINIC_OR_DEPARTMENT_OTHER): Payer: Self-pay | Admitting: Surgery

## 2021-02-07 ENCOUNTER — Ambulatory Visit (HOSPITAL_BASED_OUTPATIENT_CLINIC_OR_DEPARTMENT_OTHER)
Admission: RE | Admit: 2021-02-07 | Discharge: 2021-02-07 | Disposition: A | Discharge status: Home / Self Care | Payer: Medicare Other | Attending: Surgery | Admitting: Surgery

## 2021-02-07 ENCOUNTER — Encounter (HOSPITAL_BASED_OUTPATIENT_CLINIC_OR_DEPARTMENT_OTHER)
Admission: RE | Disposition: A | Discharge status: Home / Self Care | Payer: Self-pay | Source: Home / Self Care | Attending: Surgery

## 2021-02-07 DIAGNOSIS — I1310 Hypertensive heart and chronic kidney disease without heart failure, with stage 1 through stage 4 chronic kidney disease, or unspecified chronic kidney disease: Secondary | ICD-10-CM | POA: Insufficient documentation

## 2021-02-07 DIAGNOSIS — Z79899 Other long term (current) drug therapy: Secondary | ICD-10-CM | POA: Insufficient documentation

## 2021-02-07 DIAGNOSIS — K429 Umbilical hernia without obstruction or gangrene: Secondary | ICD-10-CM | POA: Insufficient documentation

## 2021-02-07 DIAGNOSIS — K219 Gastro-esophageal reflux disease without esophagitis: Secondary | ICD-10-CM | POA: Insufficient documentation

## 2021-02-07 DIAGNOSIS — N182 Chronic kidney disease, stage 2 (mild): Secondary | ICD-10-CM | POA: Diagnosis not present

## 2021-02-07 HISTORY — DX: Umbilical hernia without obstruction or gangrene: K42.9

## 2021-02-07 HISTORY — DX: Post-traumatic stress disorder, unspecified: F43.10

## 2021-02-07 HISTORY — DX: Alcohol abuse, uncomplicated: F10.10

## 2021-02-07 HISTORY — DX: Diverticulosis of large intestine without perforation or abscess without bleeding: K57.30

## 2021-02-07 HISTORY — DX: Benign prostatic hyperplasia with lower urinary tract symptoms: N40.1

## 2021-02-07 HISTORY — DX: Unspecified osteoarthritis, unspecified site: M19.90

## 2021-02-07 HISTORY — PX: UMBILICAL HERNIA REPAIR: SHX196

## 2021-02-07 HISTORY — DX: Personal history of peptic ulcer disease: Z87.11

## 2021-02-07 HISTORY — DX: Major depressive disorder, single episode, unspecified: F32.9

## 2021-02-07 HISTORY — DX: Gastro-esophageal reflux disease without esophagitis: K21.9

## 2021-02-07 HISTORY — DX: Hypertensive heart disease without heart failure: I11.9

## 2021-02-07 HISTORY — DX: Male erectile dysfunction, unspecified: N52.9

## 2021-02-07 LAB — POCT I-STAT, CHEM 8
BUN: 16 mg/dL (ref 8–23)
Calcium, Ion: 1.17 mmol/L (ref 1.15–1.40)
Chloride: 110 mmol/L (ref 98–111)
Creatinine, Ser: 1.3 mg/dL — ABNORMAL HIGH (ref 0.61–1.24)
Glucose, Bld: 119 mg/dL — ABNORMAL HIGH (ref 70–99)
HCT: 42 % (ref 39.0–52.0)
Hemoglobin: 14.3 g/dL (ref 13.0–17.0)
Potassium: 3.8 mmol/L (ref 3.5–5.1)
Sodium: 142 mmol/L (ref 135–145)
TCO2: 20 mmol/L — ABNORMAL LOW (ref 22–32)

## 2021-02-07 SURGERY — REPAIR, HERNIA, UMBILICAL, ADULT
Anesthesia: General

## 2021-02-07 MED ORDER — ROCURONIUM BROMIDE 10 MG/ML (PF) SYRINGE
PREFILLED_SYRINGE | INTRAVENOUS | Status: AC
  Filled 2021-02-07: qty 10

## 2021-02-07 MED ORDER — CELECOXIB 200 MG PO CAPS
ORAL_CAPSULE | ORAL | Status: AC
  Filled 2021-02-07: qty 2

## 2021-02-07 MED ORDER — DEXAMETHASONE SODIUM PHOSPHATE 10 MG/ML IJ SOLN
INTRAMUSCULAR | Status: DC | PRN
  Administered 2021-02-07: 10 mg via INTRAVENOUS

## 2021-02-07 MED ORDER — MIDAZOLAM HCL 2 MG/2ML IJ SOLN
INTRAMUSCULAR | Status: AC
  Filled 2021-02-07: qty 2

## 2021-02-07 MED ORDER — CEFAZOLIN SODIUM-DEXTROSE 2-4 GM/100ML-% IV SOLN
INTRAVENOUS | Status: AC
  Filled 2021-02-07: qty 100

## 2021-02-07 MED ORDER — PROPOFOL 10 MG/ML IV BOLUS
INTRAVENOUS | Status: DC | PRN
  Administered 2021-02-07: 120 mg via INTRAVENOUS

## 2021-02-07 MED ORDER — ENOXAPARIN SODIUM 40 MG/0.4ML IJ SOSY
40.0000 mg | PREFILLED_SYRINGE | Freq: Once | INTRAMUSCULAR | Status: AC
  Administered 2021-02-07: 40 mg via SUBCUTANEOUS

## 2021-02-07 MED ORDER — ONDANSETRON HCL 4 MG/2ML IJ SOLN
INTRAMUSCULAR | Status: AC
  Filled 2021-02-07: qty 2

## 2021-02-07 MED ORDER — GABAPENTIN 300 MG PO CAPS
ORAL_CAPSULE | ORAL | Status: AC
  Filled 2021-02-07: qty 1

## 2021-02-07 MED ORDER — ACETAMINOPHEN 500 MG PO TABS
1000.0000 mg | ORAL_TABLET | ORAL | Status: AC
  Administered 2021-02-07: 1000 mg via ORAL

## 2021-02-07 MED ORDER — MIDAZOLAM HCL 5 MG/5ML IJ SOLN
INTRAMUSCULAR | Status: DC | PRN
  Administered 2021-02-07: 2 mg via INTRAVENOUS

## 2021-02-07 MED ORDER — FENTANYL CITRATE (PF) 250 MCG/5ML IJ SOLN
INTRAMUSCULAR | Status: AC
  Filled 2021-02-07: qty 5

## 2021-02-07 MED ORDER — PHENYLEPHRINE 40 MCG/ML (10ML) SYRINGE FOR IV PUSH (FOR BLOOD PRESSURE SUPPORT)
PREFILLED_SYRINGE | INTRAVENOUS | Status: AC
  Filled 2021-02-07: qty 10

## 2021-02-07 MED ORDER — LIDOCAINE 2% (20 MG/ML) 5 ML SYRINGE
INTRAMUSCULAR | Status: DC | PRN
  Administered 2021-02-07: 60 mg via INTRAVENOUS

## 2021-02-07 MED ORDER — CELECOXIB 200 MG PO CAPS
400.0000 mg | ORAL_CAPSULE | ORAL | Status: AC
  Administered 2021-02-07: 400 mg via ORAL

## 2021-02-07 MED ORDER — ROCURONIUM BROMIDE 10 MG/ML (PF) SYRINGE
PREFILLED_SYRINGE | INTRAVENOUS | Status: DC | PRN
  Administered 2021-02-07: 60 mg via INTRAVENOUS

## 2021-02-07 MED ORDER — ACETAMINOPHEN 500 MG PO TABS
ORAL_TABLET | ORAL | Status: AC
  Filled 2021-02-07: qty 2

## 2021-02-07 MED ORDER — ENOXAPARIN SODIUM 40 MG/0.4ML IJ SOSY
PREFILLED_SYRINGE | INTRAMUSCULAR | Status: AC
  Filled 2021-02-07: qty 0.4

## 2021-02-07 MED ORDER — FENTANYL CITRATE (PF) 100 MCG/2ML IJ SOLN
INTRAMUSCULAR | Status: DC | PRN
  Administered 2021-02-07: 100 ug via INTRAVENOUS

## 2021-02-07 MED ORDER — GABAPENTIN 300 MG PO CAPS
300.0000 mg | ORAL_CAPSULE | ORAL | Status: AC
  Administered 2021-02-07: 300 mg via ORAL

## 2021-02-07 MED ORDER — PROPOFOL 10 MG/ML IV BOLUS
INTRAVENOUS | Status: AC
  Filled 2021-02-07: qty 20

## 2021-02-07 MED ORDER — SUGAMMADEX SODIUM 200 MG/2ML IV SOLN
INTRAVENOUS | Status: DC | PRN
  Administered 2021-02-07: 100 mg via INTRAVENOUS
  Administered 2021-02-07: 200 mg via INTRAVENOUS

## 2021-02-07 MED ORDER — ONDANSETRON HCL 4 MG/2ML IJ SOLN
INTRAMUSCULAR | Status: DC | PRN
  Administered 2021-02-07: 4 mg via INTRAVENOUS

## 2021-02-07 MED ORDER — BUPIVACAINE HCL (PF) 0.5 % IJ SOLN
INTRAMUSCULAR | Status: DC | PRN
  Administered 2021-02-07: 30 mL

## 2021-02-07 MED ORDER — LIDOCAINE 2% (20 MG/ML) 5 ML SYRINGE
INTRAMUSCULAR | Status: AC
  Filled 2021-02-07: qty 5

## 2021-02-07 MED ORDER — EPHEDRINE SULFATE 50 MG/ML IJ SOLN
INTRAMUSCULAR | Status: DC | PRN
  Administered 2021-02-07 (×2): 10 mg via INTRAVENOUS
  Administered 2021-02-07: 5 mg via INTRAVENOUS

## 2021-02-07 MED ORDER — CHLORHEXIDINE GLUCONATE CLOTH 2 % EX PADS: 6.0000 | MEDICATED_PAD | Freq: Once | CUTANEOUS | Status: DC

## 2021-02-07 MED ORDER — CEFAZOLIN SODIUM-DEXTROSE 2-4 GM/100ML-% IV SOLN
2.0000 g | INTRAVENOUS | Status: AC
  Administered 2021-02-07: 2 g via INTRAVENOUS

## 2021-02-07 MED ORDER — OXYCODONE-ACETAMINOPHEN 5-325 MG PO TABS: 1.0000 | ORAL_TABLET | ORAL | 0 refills | Status: DC | PRN

## 2021-02-07 MED ORDER — LACTATED RINGERS IV SOLN: INTRAVENOUS | Status: DC

## 2021-02-07 MED ORDER — DEXAMETHASONE SODIUM PHOSPHATE 10 MG/ML IJ SOLN
INTRAMUSCULAR | Status: AC
  Filled 2021-02-07: qty 1

## 2021-02-07 MED ORDER — BUPIVACAINE LIPOSOME 1.3 % IJ SUSP: 20.0000 mL | Freq: Once | INTRAMUSCULAR | Status: DC

## 2021-02-07 MED ORDER — EPHEDRINE 5 MG/ML INJ
INTRAVENOUS | Status: AC
  Filled 2021-02-07: qty 5

## 2021-02-07 MED ORDER — BUPIVACAINE LIPOSOME 1.3 % IJ SUSP
INTRAMUSCULAR | Status: DC | PRN
  Administered 2021-02-07: 20 mL

## 2021-02-07 SURGICAL SUPPLY — 38 items
ADH SKN CLS APL DERMABOND .7 (GAUZE/BANDAGES/DRESSINGS) ×1
APL PRP STRL LF DISP 70% ISPRP (MISCELLANEOUS) ×1
BLADE CLIPPER SENSICLIP SURGIC (BLADE) ×2 IMPLANT
BLADE SURG 15 STRL LF DISP TIS (BLADE) ×1 IMPLANT
BLADE SURG 15 STRL SS (BLADE) ×2
CHLORAPREP W/TINT 26 (MISCELLANEOUS) ×2 IMPLANT
COVER BACK TABLE 60X90IN (DRAPES) ×2 IMPLANT
COVER MAYO STAND STRL (DRAPES) ×2 IMPLANT
DECANTER SPIKE VIAL GLASS SM (MISCELLANEOUS) IMPLANT
DERMABOND ADVANCED (GAUZE/BANDAGES/DRESSINGS) ×1
DERMABOND ADVANCED .7 DNX12 (GAUZE/BANDAGES/DRESSINGS) ×1 IMPLANT
DRAPE LAPAROTOMY 100X72 PEDS (DRAPES) ×2 IMPLANT
DRAPE UTILITY XL STRL (DRAPES) ×2 IMPLANT
ELECT COATED BLADE 2.86 ST (ELECTRODE) ×2 IMPLANT
ELECT REM PT RETURN 9FT ADLT (ELECTROSURGICAL) ×2
ELECTRODE REM PT RTRN 9FT ADLT (ELECTROSURGICAL) ×1 IMPLANT
GAUZE 4X4 16PLY ~~LOC~~+RFID DBL (SPONGE) ×2 IMPLANT
GLOVE SRG 8 PF TXTR STRL LF DI (GLOVE) ×1 IMPLANT
GLOVE SURG ENC MOIS LTX SZ7.5 (GLOVE) ×2 IMPLANT
GLOVE SURG UNDER POLY LF SZ8 (GLOVE) ×2
GOWN STRL REUS W/TWL XL LVL3 (GOWN DISPOSABLE) ×4 IMPLANT
KIT TURNOVER CYSTO (KITS) ×2 IMPLANT
MESH BARD SOFT 6X6IN (Mesh General) ×2 IMPLANT
NEEDLE HYPO 22GX1.5 SAFETY (NEEDLE) ×2 IMPLANT
NS IRRIG 1000ML POUR BTL (IV SOLUTION) IMPLANT
PACK BASIN DAY SURGERY FS (CUSTOM PROCEDURE TRAY) ×2 IMPLANT
PENCIL SMOKE EVACUATOR (MISCELLANEOUS) ×2 IMPLANT
SPONGE T-LAP 4X18 ~~LOC~~+RFID (SPONGE) ×2 IMPLANT
SUT MNCRL AB 4-0 PS2 18 (SUTURE) ×2 IMPLANT
SUT NOVA NAB GS-21 0 18 T12 DT (SUTURE) ×2 IMPLANT
SUT VIC AB 0 SH 27 (SUTURE) IMPLANT
SUT VIC AB 2-0 SH 27 (SUTURE)
SUT VIC AB 2-0 SH 27XBRD (SUTURE) IMPLANT
SUT VIC AB 3-0 SH 27 (SUTURE) ×2
SUT VIC AB 3-0 SH 27X BRD (SUTURE) ×1 IMPLANT
SUT VICRYL AB 3 0 TIES (SUTURE) IMPLANT
SYR CONTROL 10ML LL (SYRINGE) ×2 IMPLANT
TOWEL OR 17X26 10 PK STRL BLUE (TOWEL DISPOSABLE) ×2 IMPLANT

## 2021-02-07 NOTE — Anesthesia Procedure Notes (Signed)
Procedure Name: Intubation Date/Time: 02/07/2021 8:38 AM Performed by: Rogers Blocker, CRNA Pre-anesthesia Checklist: Patient identified, Emergency Drugs available, Suction available and Patient being monitored Patient Re-evaluated:Patient Re-evaluated prior to induction Oxygen Delivery Method: Circle System Utilized Preoxygenation: Pre-oxygenation with 100% oxygen Induction Type: IV induction Ventilation: Mask ventilation without difficulty Laryngoscope Size: Mac and 4 Grade View: Grade I Tube type: Oral Tube size: 7.5 mm Number of attempts: 1 Airway Equipment and Method: Stylet and Oral airway Placement Confirmation: ETT inserted through vocal cords under direct vision, positive ETCO2 and breath sounds checked- equal and bilateral Secured at: 22 cm Tube secured with: Tape Dental Injury: Teeth and Oropharynx as per pre-operative assessment

## 2021-02-07 NOTE — H&P (Signed)
Admitting Physician: Hyman Hopes Malyia Moro  Service: General surgery  CC: umbilical hernia  Subjective   HPI: Joshua Solis is an 75 y.o. male who is here for umbilical hernia repair  Past Medical History:  Diagnosis Date   Alcohol abuse    02-02-2021  pt stated drinks 2 beers daily (12 oz) and one pint of gin daily   Arthritis    BPH associated with nocturia    CKD (chronic kidney disease), stage II    a. Cr 1.3 during 09/2015 adm, chronicity unclear at that time; f/u value 1.18.   Diverticulosis of colon    ED (erectile dysfunction)    Fatty liver    GERD (gastroesophageal reflux disease)    History of gastric ulcer    remote hx   Hyperlipidemia    Hypertensive heart disease without heart failure    cardiologist-- dr hochrein;   ETT 08-18-2015 in epic, no ischemia and hx [facility] small reversible inferior defect;  echo 12-11-2020 in epic ef 60-65%   MDD (major depressive disorder)    PTSD (post-traumatic stress disorder)    Umbilical hernia     Past Surgical History:  Procedure Laterality Date   INGUINAL HERNIA REPAIR Left 2000   KNEE ARTHROSCOPY W/ MENISCAL REPAIR Right 08/11/2020   @[facility]    VITRECTOMY Bilateral 2017   approx    Family History  Problem Relation Age of Onset   Stroke Mother 61   Pneumonia Mother     Social:  reports that he has never smoked. He has never used smokeless tobacco. He reports current alcohol use of about 84.0 standard drinks per week. He reports that he does not use drugs.  Allergies: No Known Allergies  Medications: Current Outpatient Medications  Medication Instructions   amLODipine (NORVASC) 10 mg, Oral, Daily   aspirin (ASPIRIN LOW DOSE) 81 MG EC tablet TAKE 1 TABLET(81 MG) BY MOUTH DAILY   atorvastatin (LIPITOR) 80 mg, Oral, Daily at bedtime   calcium carbonate (TUMS - DOSED IN MG ELEMENTAL CALCIUM) 500 MG chewable tablet 1 tablet, Oral, Daily   cetirizine (ZYRTEC) 10 mg, Oral, Daily    ibuprofen (ADVIL) 400 mg, Oral, Every 6 hours PRN   metoprolol tartrate (LOPRESSOR) 25 mg, Oral, 2 times daily   nitroGLYCERIN (NITROSTAT) 0.4 mg, Every 5 min PRN   omeprazole (PRILOSEC) 20 mg, Oral, Daily   sertraline (ZOLOFT) 100 mg, Oral, Daily at bedtime   sildenafil (VIAGRA) 100 mg, Oral, Daily PRN   tamsulosin (FLOMAX) 0.4 mg, Oral, Daily at bedtime   terazosin (HYTRIN) 10 mg, Oral, Daily at bedtime   traZODone (DESYREL) 50 mg, Oral, Daily at bedtime    ROS - all of the below systems have been reviewed with the patient and positives are indicated with bold text General: chills, fever or night sweats Eyes: blurry vision or double vision ENT: epistaxis or sore throat Allergy/Immunology: itchy/watery eyes or nasal congestion Hematologic/Lymphatic: bleeding problems, blood clots or swollen lymph nodes Endocrine: temperature intolerance or unexpected weight changes Breast: new or changing breast lumps or nipple discharge Resp: cough, shortness of breath, or wheezing CV: chest pain or dyspnea on exertion GI: as per HPI GU: dysuria, trouble voiding, or hematuria MSK: joint pain or joint stiffness Neuro: TIA or stroke symptoms Derm: pruritus and skin lesion changes Psych: anxiety and depression  Objective   PE Blood pressure 138/79, pulse 69, temperature 99 F (37.2 C), temperature source Oral, resp. rate (!) 69, height 5\' 8"  (  1.727 m), weight 85.4 kg, SpO2 97 %. Constitutional: NAD; conversant; no deformities Eyes: Moist conjunctiva; no lid lag; anicteric; PERRL Neck: Trachea midline; no thyromegaly Lungs: Normal respiratory effort; no tactile fremitus CV: RRR; no palpable thrills; no pitting edema GI: Abd Umbilical hernia; no palpable hepatosplenomegaly MSK: Normal range of motion of extremities; no clubbing/cyanosis Psychiatric: Appropriate affect; alert and oriented x3 Lymphatic: No palpable cervical or axillary lymphadenopathy  Results for orders placed or performed  during the hospital encounter of 02/07/21 (from the past 24 hour(s))  I-STAT, chem 8     Status: Abnormal   Collection Time: 02/07/21  7:41 AM  Result Value Ref Range   Sodium 142 135 - 145 mmol/L   Potassium 3.8 3.5 - 5.1 mmol/L   Chloride 110 98 - 111 mmol/L   BUN 16 8 - 23 mg/dL   Creatinine, Ser 1.30 (H) 0.61 - 1.24 mg/dL   Glucose, Bld 119 (H) 70 - 99 mg/dL   Calcium, Ion 1.17 1.15 - 1.40 mmol/L   TCO2 20 (L) 22 - 32 mmol/L   Hemoglobin 14.3 13.0 - 17.0 g/dL   HCT 42.0 39.0 - 52.0 %    Imaging Orders  No imaging studies ordered today  CT Abd/Pel 06/29/20 1. No acute abdominopelvic findings. 2. Extensive right and left-sided colonic diverticulosis without findings of acute diverticulitis. 3. Small ventral hernia containing fat and nonobstructed loop of small bowel. 4. Prostatomegaly. 5. Aortic atherosclerosis.   Assessment and Plan   Umbilical hernia without obstruction and without gangrene    I recommended open umbilical hernia repair with mesh. The procedure itself as well as its risk, benefits, and alternatives were discussed with the patient. The risk discussed included were not limited to the risk of infection, bleeding, damage nearby structures. After full discussion all questions answered the patient granted consent to proceed.    Felicie Morn, MD  [facility] Use [email or web address] to contact on call provider

## 2021-02-07 NOTE — Discharge Instructions (Addendum)
 VENTRAL HERNIA REPAIR POST OPERATIVE INSTRUCTIONS  Thinking Clearly  The anesthesia may cause you to feel different for 1 or 2 days. Do not drive, drink alcohol, or make any big decisions for at least 2 days.  Nutrition When you wake up, you will be able to drink small amounts of liquid. If you do not feel sick, you can slowly advance your diet to regular foods. Continue to drink lots of fluids, usually about 8 to 10 glasses per day. Eat a high-fiber diet so you don't strain during bowel movements. High-Fiber Foods Foods high in fiber include beans, bran cereals and whole-grain breads, peas, dried fruit (figs, apricots, and dates), raspberries, blackberries, strawberries, sweet corn, broccoli, baked potatoes with skin, plums, pears, apples, greens, and nuts. Activity Slowly increase your activity. Be sure to get up and walk every hour or so to prevent blood clots. No heavy lifting or strenuous activity for 4 weeks following surgery to prevent hernias at your incision sites or recurrence of your hernia. It is normal to feel tired. You may need more sleep than usual.  Get your rest but make sure to get up and move around frequently to prevent blood clots and pneumonia.  Work and Return to School You can go back to work when you feel well enough. Discuss the timing with your surgeon. You can usually go back to school or work 1 week or less after an laparoscopic or an open repair. If your work requires heavy lifting or strenuous activity you need to be placed on light duty for 4 weeks following surgery. You can return to gym class, sports or other physical activities 4 weeks after surgery.  Wound Care You may experience significant bruising throughout the abdominal wall that may track down into the groin including into the scrotum in males.  Rest, elevating the groin and scrotum above the level of the heart, ice and compression with tight fitting underwear or an abdominal binder can help.   Always wash your hands before and after touching near your incision site. Do not soak in a bathtub until cleared at your follow up appointment. You may take a shower 24 hours after surgery. A small amount of drainage from the incision is normal. If the drainage is thick and yellow or the site is red, you may have an infection, so call your surgeon. If you have a drain in one of your incisions, it will be taken out in office when the drainage stops. Steri-Strips will fall off in 7 to 10 days or they will be removed during your first office visit. If you have dermabond glue covering over the incision, allow the glue to flake off on its own. Protect the new skin, especially from the sun. The sun can burn and cause darker scarring. Your scar will heal in about 4 to 6 weeks and will become softer and continue to fade over the next year.  The cosmetic appearance of the incisions will improve over the course of the first year after surgery. Sensation around your incision will return in a few weeks or months.  Bowel Movements After intestinal surgery, you may have loose watery stools for several days. If watery diarrhea lasts longer than 3 days, contact your surgeon. Pain medication (narcotics) can cause constipation. Increase the fiber in your diet with high-fiber foods if you are constipated. You can take an over the counter stool softener like Colace to avoid constipation.  Additional over the counter medications can also be used   if Colace isn't sufficient (for example, Milk of Magnesia or Miralax).  Pain The amount of pain is different for each person. Some people need only 1 to 3 doses of pain control medication, while others need more. Take alternating doses of tylenol and ibuprofen around the clock for the first five days following surgery.  This will provide a baseline of pain control and help with inflammation.  Take the narcotic pain medication in addition if needed for severe pain.  Contact  Your Surgeon at [phone], if you have: Pain that will not go away Pain that gets worse A fever of more than 101F (38.3C) Repeated vomiting Swelling, redness, bleeding, or bad-smelling drainage from your wound site Strong abdominal pain No bowel movement or unable to pass gas for 3 days Watery diarrhea lasting longer than 3 days  Pain Control The goal of pain control is to minimize pain, keep you moving and help you heal. Your surgical team will work with you on your pain plan. Most often a combination of therapies and medications are used to control your pain. You may also be given medication (local anesthetic) at the surgical site. This may help control your pain for several days. Extreme pain puts extra stress on your body at a time when your body needs to focus on healing. Do not wait until your pain has reached a level "10" or is unbearable before telling your doctor or nurse. It is much easier to control pain before it becomes severe. Following a laparoscopic procedure, pain is sometimes felt in the shoulder. This is due to the gas inserted into your abdomen during the procedure. Moving and walking helps to decrease the gas and the right shoulder pain.  Use the guide below for ways to manage your post-operative pain. Learn more by going to facs.org/safepaincontrol.  How Intense Is My Pain Common Therapies to Feel Better       I hardly notice my pain, and it does not interfere with my activities.  I notice my pain and it distracts me, but I can still do activities (sitting up, walking, standing).  Non-Medication Therapies  Ice (in a bag, applied over clothing at the surgical site), elevation, rest, meditation, massage, distraction (music, TV, play) walking and mild exercise Splinting the abdomen with pillows +  Non-Opioid Medications Acetaminophen (Tylenol) Non-steroidal anti-inflammatory drugs (NSAIDS) Aspirin, Ibuprofen (Motrin, Advil) Naproxen (Aleve) Take these as  needed, when you feel pain. Both acetaminophen and NSAIDs help to decrease pain and swelling (inflammation).      My pain is hard to ignore and is more noticeable even when I rest.  My pain interferes with my usual activities.  Non-Medication Therapies  +  Non-Opioid medications  Take on a regular schedule (around-the-clock) instead of as needed. (For example, Tylenol every 6 hours at 9:00 am, 3:00 pm, 9:00 pm, 3:00 am and Motrin every 6 hours at 12:00 am, 6:00 am, 12:00 pm, 6:00 pm)         I am focused on my pain, and I am not doing my daily activities.  I am groaning in pain, and I cannot sleep. I am unable to do anything.  My pain is as bad as it could be, and nothing else matters.  Non-Medication Therapies  +  Around-the-Clock Non-Opioid Medications  +  Short-acting opioids  Opioids should be used with other medications to manage severe pain. Opioids block pain and give a feeling of euphoria (feel high). Addiction, a serious side effect of opioids, is   rare with short-term (a few days) use.  Examples of short-acting opioids include: Tramadol (Ultram), Hydrocodone (Norco, Vicodin), Hydromorphone (Dilaudid), Oxycodone (Oxycontin)     The above directions have been adapted from the [employer] Surgical Patient Education Program.  Please refer to the ACS website if needed: http://kaiser.net/   Ivar Drape, MD [facility] [place] [address], [place], [place]  [address] ?  [address] [address], [place], [place]   [address] [phone] ? [phone] ? FAX [phone] Web site: [email or web address]    Post Anesthesia Home Care Instructions  Activity: Get plenty of rest for the remainder of the day. A responsible individual must stay with you for 24 hours following the procedure.  For the next 24 hours, DO NOT: -Drive a car -Social worker -Drink alcoholic beverages -Take any medication unless instructed by your physician -Make any legal decisions or sign important papers.  Meals: Start with liquid foods such as gelatin or soup. Progress to regular foods as tolerated. Avoid greasy, spicy, heavy foods. If nausea and/or vomiting occur, drink only clear liquids until the nausea and/or vomiting subsides. Call your physician if vomiting continues.  Special Instructions/Symptoms: Your throat may feel dry or sore from the anesthesia or the breathing tube placed in your throat during surgery. If this causes discomfort, gargle with warm salt water. The discomfort should disappear within 24 hours.  If you had a scopolamine patch placed behind your ear for the management of post- operative nausea and/or vomiting:  1. The medication in the patch is effective for 72 hours, after which it should be removed.  Wrap patch in a tissue and discard in the trash. Wash hands thoroughly with soap and water. 2. You may remove the patch earlier than 72 hours if you experience unpleasant side effects which may include dry mouth, dizziness or visual disturbances. 3. Avoid touching the patch. Wash your hands with soap and water after contact with the patch.  Do not take any Tylenol until after 1:15 pm today.      Information for Discharge Teaching: EXPAREL (bupivacaine liposome injectable suspension)   Your surgeon or anesthesiologist gave you EXPAREL(bupivacaine) to help control your pain after surgery.  EXPAREL is a local anesthetic that provides pain relief by numbing the tissue around the surgical site. EXPAREL is designed to release pain medication over time and can control pain for up to 72 hours. Depending on how you respond to EXPAREL, you may require less pain medication during your recovery.  Possible side effects: Temporary loss of sensation or ability to move in the area where bupivacaine was injected. Nausea, vomiting,  constipation Rarely, numbness and tingling in your mouth or lips, lightheadedness, or anxiety may occur. Call your doctor right away if you think you may be experiencing any of these sensations, or if you have other questions regarding possible side effects.  Follow all other discharge instructions given to you by your surgeon or nurse. Eat a healthy diet and drink plenty of water or other fluids.  If you return to the hospital for any reason within 96 hours following the administration of EXPAREL, it is important for health care providers to know that you have received this anesthetic. A teal colored band has been placed on your arm with the date, time and amount of EXPAREL you have received in order to alert and inform your health care providers. Please leave this armband in place for the full 96 hours following administration, and then you  may remove the band.

## 2021-02-07 NOTE — Anesthesia Postprocedure Evaluation (Signed)
Anesthesia Post Note  Patient: Joshua Solis  Procedure(s) Performed: HERNIA REPAIR UMBILICAL ADULT WITH MESH     Patient location during evaluation: PACU Anesthesia Type: General Level of consciousness: awake and alert Pain management: pain level controlled Vital Signs Assessment: post-procedure vital signs reviewed and stable Respiratory status: spontaneous breathing, nonlabored ventilation and respiratory function stable Cardiovascular status: blood pressure returned to baseline and stable Postop Assessment: no apparent nausea or vomiting Anesthetic complications: no   No notable events documented.  Last Vitals:  Vitals:   02/07/21 1029 02/07/21 1030  BP:  117/71  Pulse: 63 61  Resp: 15 16  Temp:    SpO2: 98% 98%    Last Pain:  Vitals:   02/07/21 1030  TempSrc:   PainSc: 6                  Digna Countess,W. EDMOND

## 2021-02-07 NOTE — Transfer of Care (Signed)
Immediate Anesthesia Transfer of Care Note  Patient: Joshua Solis  Procedure(s) Performed: HERNIA REPAIR UMBILICAL ADULT WITH MESH  Patient Location: PACU  Anesthesia Type:General  Level of Consciousness: awake, alert , oriented and patient cooperative  Airway & Oxygen Therapy: Patient Spontanous Breathing and Patient connected to face mask oxygen  Post-op Assessment: Report given to RN and Post -op Vital signs reviewed and stable  Post vital signs: Reviewed and stable  Last Vitals:  Vitals Value Taken Time  BP 132/84 02/07/21 0940  Temp    Pulse 65 02/07/21 0943  Resp 25 02/07/21 0943  SpO2 99 % 02/07/21 0943  Vitals shown include unvalidated device data.  Last Pain:  Vitals:   02/07/21 0715  TempSrc: Oral  PainSc: 0-No pain      Patients Stated Pain Goal: 6 (02/07/21 0715)  Complications: No notable events documented.

## 2021-02-07 NOTE — Op Note (Signed)
   Patient: Joshua Solis (January 19, 1946, [id number])  Date of Surgery: 02/07/2021   Preoperative Diagnosis: UMBILICAL HERNIA   Postoperative Diagnosis: UMBILICAL HERNIA   Surgical Procedure: HERNIA REPAIR UMBILICAL ADULT WITH MESH:    Operative Team Members:  Surgeon(s) and Role:    * Wavie Hashimi, Hyman Hopes, MD - Primary   Anesthesiologist: Gaynelle Adu, MD CRNA: Bishop Limbo, CRNA; Francie Massing, CRNA   Anesthesia: General   Fluids:  Total I/O In: 700 [I.V.:600; IV Piggyback:100] Out: -   Complications: * No complications entered in OR log *  Drains:  none   Specimen: * No specimens in log *   Disposition:  PACU - hemodynamically stable.  Plan of Care: Discharge to home after PACU    Indications for Procedure: Joshua Solis is a 75 y.o. male who presented with an umbilical hernia.   I recommended open umbilical hernia repair with mesh. The procedure itself as well as its risk, benefits, and alternatives were discussed with the patient. The risk discussed included were not limited to the risk of infection, bleeding, damage nearby structures. After full discussion all questions answered the patient granted consent to proceed.   Findings:  Hernia Location: Umbilical hernia Hernia Size:  3.5 cm wide x 3 cm tall  Mesh Size &Type:  10 cm x 10 cm cm Bard Soft Mesh Position: Preperitoneal  Description of Procedure:  The patient was positioned supine, padded and secured to the bed.  The abdomen was widely prepped and draped.  A time out procedure was performed.    A curvilinear incision was made below the umbilicus and dissection was carried down through the subcutaneous tissue to the level of the fascia.  The umbilical stalk was encircled and the hernia sac amputated off the umbilical skin.  The hernia defect was measured as 3.5 cm wide by 3 cm in vertical dimension.     An umbilical hernia repair with mesh was performed.  The hernia sac was scored along the  perimeter of the fascial defect, entering the pre peritoneal plane.  A wide pre peritoneal dissection was performed creating a space for mesh placement.  A piece of bard soft was opened and trimmed to 10 x 10 cm and deployed into the pre peritoneal space.  The mesh laid in taut position in the pre peritoneal plane and did not require fixation.  The space was irrigated with normal saline.  The fascial defect was closed horizontally, overtop the mesh with figure of eight 0 Novafil suture. Marcaine mixed with exparel was injected around the field.  The umbilical skin was tacked down to the fascial repair with 4-0 Monocryl suture.  The skin was closed with 4-0 Monocryl subcuticular suture and skin glue.     Ivar Drape, MD General, Bariatric, & Minimally Invasive Surgery [facility], [place]

## 2021-02-08 ENCOUNTER — Encounter (HOSPITAL_BASED_OUTPATIENT_CLINIC_OR_DEPARTMENT_OTHER): Payer: Self-pay | Admitting: Surgery

## 2021-04-30 IMAGING — CT CT ABD-PELV W/ CM
2 of 5 series · 15 of 46 positions shown, 17 images · IV contrast (Omnipaque)
Comparison: CT January 13, 2017

CLINICAL DATA: Right-sided abdominal pain radiating to the right
flank

EXAM:
CT ABDOMEN AND PELVIS WITH CONTRAST
TECHNIQUE: Multidetector CT imaging of the abdomen and pelvis was performed
using the standard protocol following bolus administration of
intravenous contrast.
CONTRAST:  100mL OMNIPAQUE IOHEXOL 300 MG/ML  SOLN

[Series 2: axial st · axial · 0.74mm/px · z∈[-588,-178]mm · 12 of 92 slices shown, 14 images]
[im 5/92  soft-tissue]
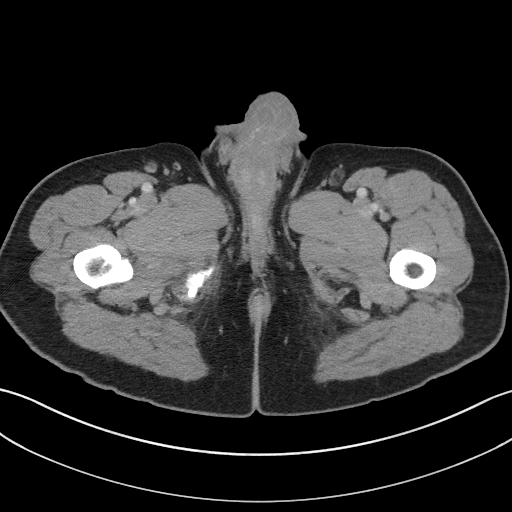
[im 5/92  bone]
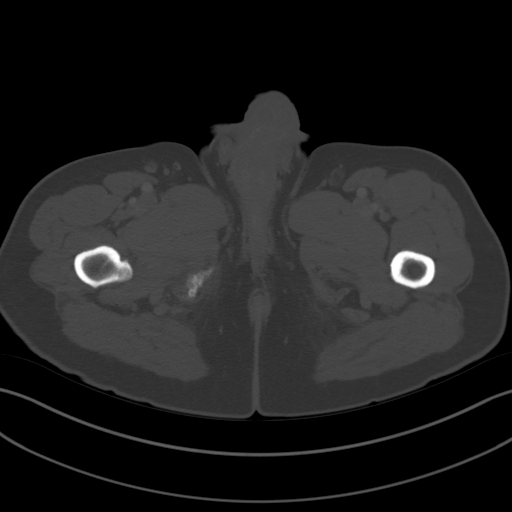
[im 15/92  soft-tissue]
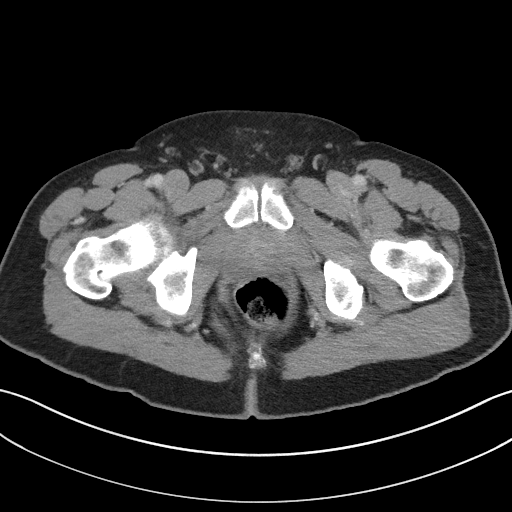
[im 20/92  soft-tissue]
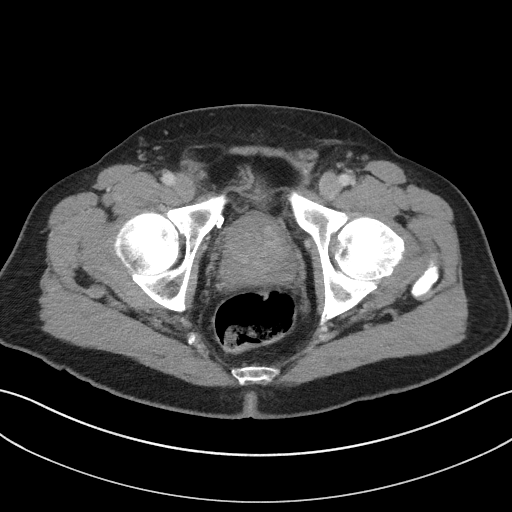
[im 29/92  soft-tissue]
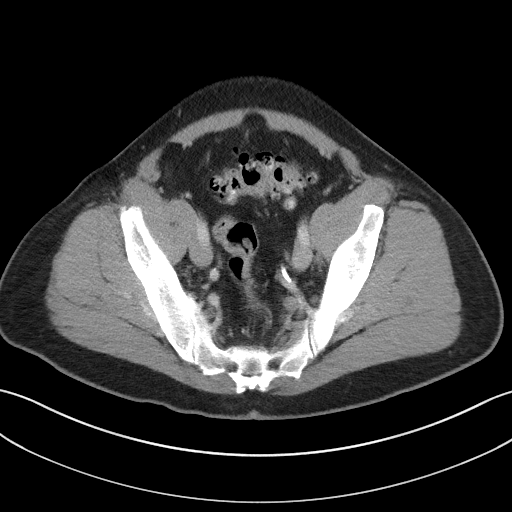
[im 34/92  soft-tissue]
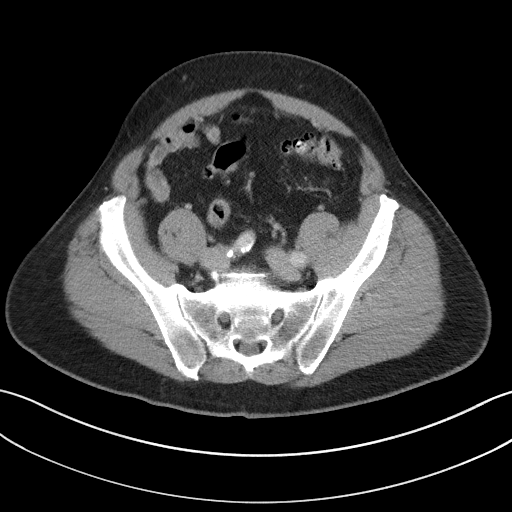
[im 44/92  soft-tissue]
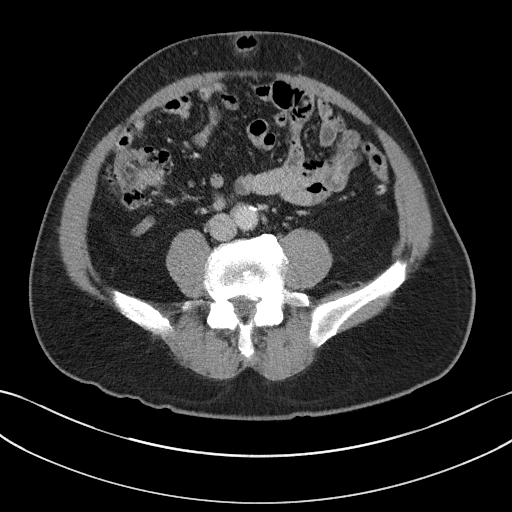
[im 48/92  soft-tissue]
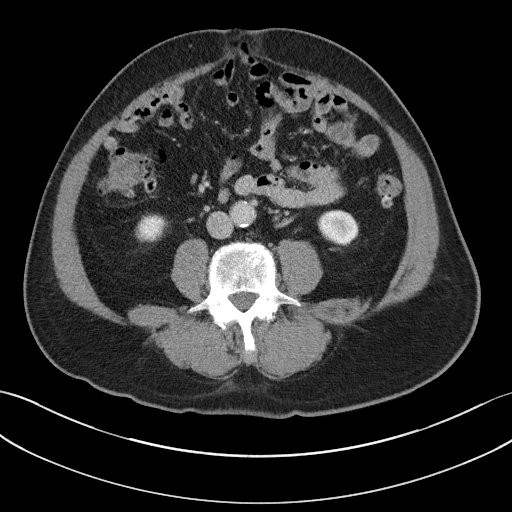
[im 58/92  soft-tissue]
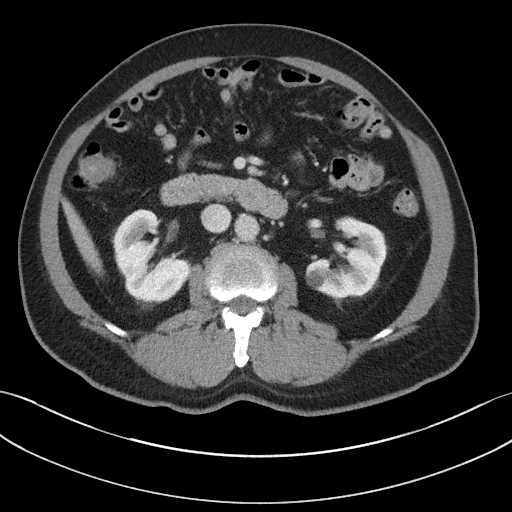
[im 63/92  soft-tissue]
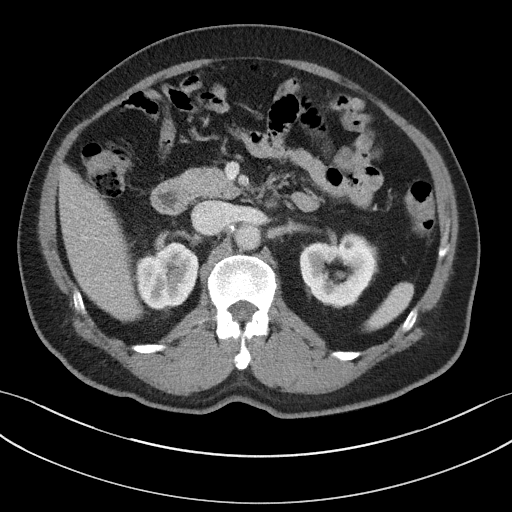
[im 63/92  bone]
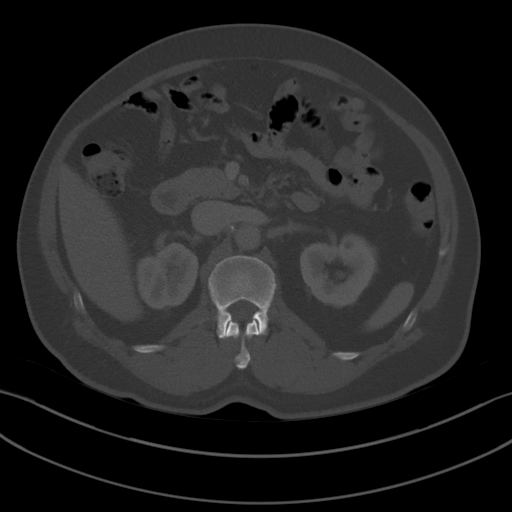
[im 72/92  soft-tissue]
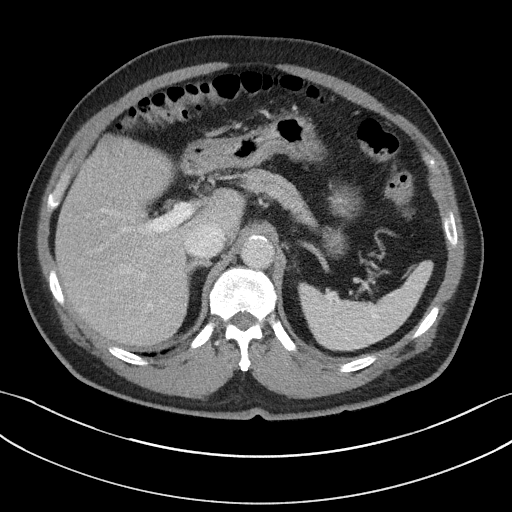
[im 77/92  soft-tissue]
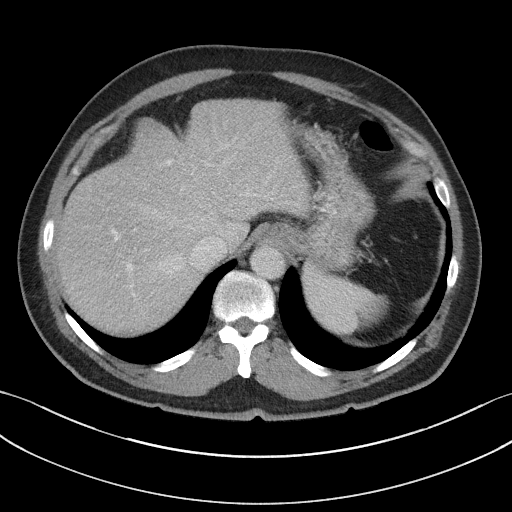
[im 87/92  soft-tissue]
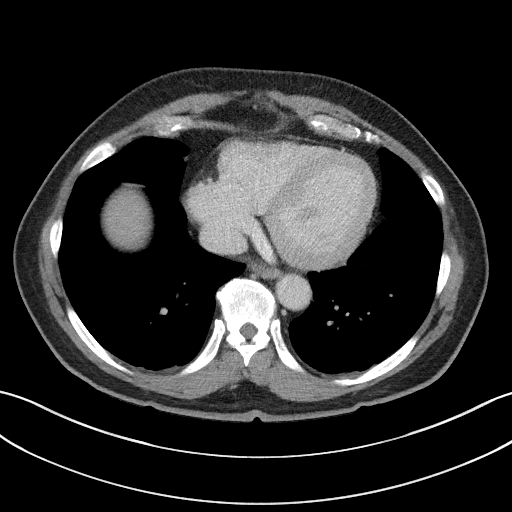

[Series 5: coronal st · coronal · 0.67mm/px · 3 of 103 slices shown]
[im 35/103  soft-tissue]
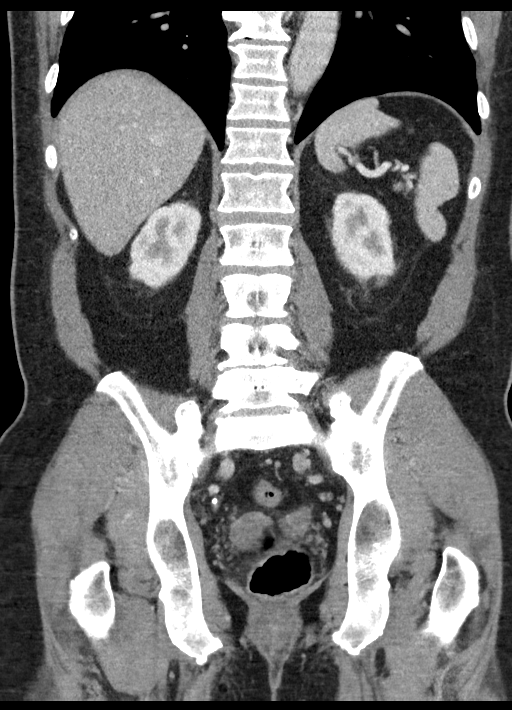
[im 46/103  soft-tissue]
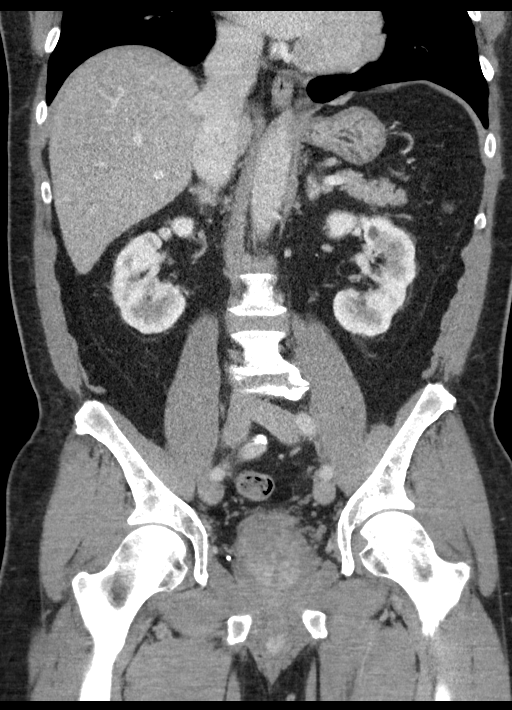
[im 57/103  soft-tissue]
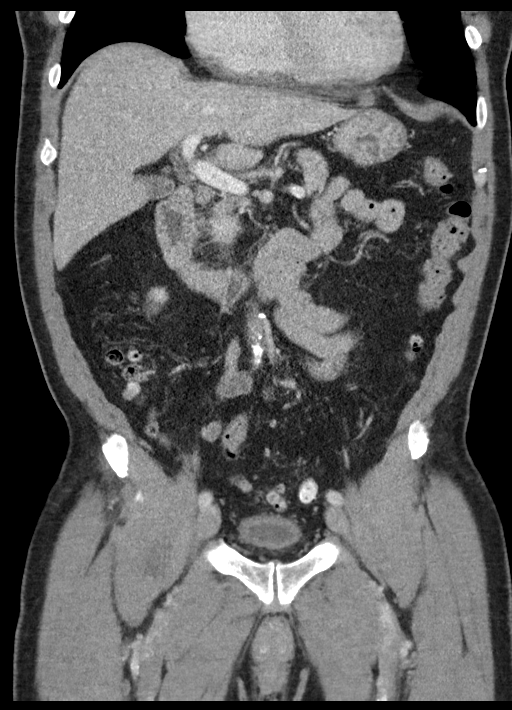

[15 of 46 positions shown; findings below may reference images not displayed]

FINDINGS: Lower chest: No acute abnormality.

Hepatobiliary: Bilobar hypodense hepatic lesions measuring up to
cm in the left lobe of the liver on image [DATE] consistent with
hepatic cysts. No solid enhancing hepatic lesions. Gallbladder is
grossly unremarkable. No biliary ductal dilation.

Pancreas: Unremarkable. No pancreatic ductal dilatation or
surrounding inflammatory changes.

Spleen: Normal in size without focal abnormality.

Adrenals/Urinary Tract: The bilateral adrenal glands are
unremarkable.

Similar appearance of the small bilateral renal cysts measuring up
to 1.3 cm in the left interpolar region. No solid enhancing renal
lesions. No hydronephrosis. Bilateral renal scarring.

Urinary bladder is decompressed limiting evaluation.

Stomach/Bowel: Stomach is grossly unremarkable for degree of
distension. No suspicious small bowel wall thickening or dilation.
Normal appendix. Extensive right and left-sided colonic
diverticulosis without findings of acute diverticulitis.

Vascular/Lymphatic: Aortic atherosclerosis. No enlarged abdominal or
pelvic lymph nodes.

Reproductive: Prostatomegaly

Other: Small ventral hernia containing fat and nonobstructed loop of
small bowel.

Musculoskeletal: Multilevel degenerative changes spine. No acute
osseous abnormality.
IMPRESSION: 1. No acute abdominopelvic findings.
2. Extensive right and left-sided colonic diverticulosis without
findings of acute diverticulitis.
3. Small ventral hernia containing fat and nonobstructed loop of
small bowel.
4. Prostatomegaly.
5. Aortic atherosclerosis.

Aortic Atherosclerosis (UZVAE-COA.A).

## 2021-12-04 ENCOUNTER — Emergency Department (HOSPITAL_BASED_OUTPATIENT_CLINIC_OR_DEPARTMENT_OTHER): Payer: No Typology Code available for payment source

## 2021-12-04 ENCOUNTER — Other Ambulatory Visit: Payer: Self-pay

## 2021-12-04 ENCOUNTER — Observation Stay (HOSPITAL_BASED_OUTPATIENT_CLINIC_OR_DEPARTMENT_OTHER)
Admission: EM | Admit: 2021-12-04 | Discharge: 2021-12-07 | Disposition: A | Discharge status: Home / Self Care | Payer: No Typology Code available for payment source | Attending: Internal Medicine | Admitting: Internal Medicine

## 2021-12-04 ENCOUNTER — Encounter (HOSPITAL_BASED_OUTPATIENT_CLINIC_OR_DEPARTMENT_OTHER): Payer: Self-pay | Admitting: Emergency Medicine

## 2021-12-04 DIAGNOSIS — R7989 Other specified abnormal findings of blood chemistry: Secondary | ICD-10-CM

## 2021-12-04 DIAGNOSIS — I10 Essential (primary) hypertension: Secondary | ICD-10-CM | POA: Diagnosis present

## 2021-12-04 DIAGNOSIS — K760 Fatty (change of) liver, not elsewhere classified: Secondary | ICD-10-CM | POA: Insufficient documentation

## 2021-12-04 DIAGNOSIS — D649 Anemia, unspecified: Secondary | ICD-10-CM | POA: Insufficient documentation

## 2021-12-04 DIAGNOSIS — E876 Hypokalemia: Secondary | ICD-10-CM | POA: Diagnosis not present

## 2021-12-04 DIAGNOSIS — I1310 Hypertensive heart and chronic kidney disease without heart failure, with stage 1 through stage 4 chronic kidney disease, or unspecified chronic kidney disease: Secondary | ICD-10-CM | POA: Diagnosis not present

## 2021-12-04 DIAGNOSIS — N182 Chronic kidney disease, stage 2 (mild): Secondary | ICD-10-CM | POA: Diagnosis not present

## 2021-12-04 DIAGNOSIS — R778 Other specified abnormalities of plasma proteins: Secondary | ICD-10-CM | POA: Insufficient documentation

## 2021-12-04 DIAGNOSIS — Z5181 Encounter for therapeutic drug level monitoring: Secondary | ICD-10-CM

## 2021-12-04 DIAGNOSIS — Z7982 Long term (current) use of aspirin: Secondary | ICD-10-CM | POA: Diagnosis not present

## 2021-12-04 DIAGNOSIS — I2510 Atherosclerotic heart disease of native coronary artery without angina pectoris: Secondary | ICD-10-CM | POA: Diagnosis not present

## 2021-12-04 DIAGNOSIS — K2970 Gastritis, unspecified, without bleeding: Secondary | ICD-10-CM | POA: Insufficient documentation

## 2021-12-04 DIAGNOSIS — K209 Esophagitis, unspecified without bleeding: Secondary | ICD-10-CM | POA: Insufficient documentation

## 2021-12-04 DIAGNOSIS — R1013 Epigastric pain: Secondary | ICD-10-CM | POA: Diagnosis present

## 2021-12-04 DIAGNOSIS — K269 Duodenal ulcer, unspecified as acute or chronic, without hemorrhage or perforation: Principal | ICD-10-CM | POA: Insufficient documentation

## 2021-12-04 DIAGNOSIS — Z79899 Other long term (current) drug therapy: Secondary | ICD-10-CM | POA: Insufficient documentation

## 2021-12-04 DIAGNOSIS — I517 Cardiomegaly: Secondary | ICD-10-CM | POA: Diagnosis present

## 2021-12-04 DIAGNOSIS — R079 Chest pain, unspecified: Secondary | ICD-10-CM | POA: Diagnosis not present

## 2021-12-04 DIAGNOSIS — E785 Hyperlipidemia, unspecified: Secondary | ICD-10-CM | POA: Diagnosis present

## 2021-12-04 LAB — CBC
HCT: 38.3 % — ABNORMAL LOW (ref 39.0–52.0)
Hemoglobin: 13.6 g/dL (ref 13.0–17.0)
MCH: 32.2 pg (ref 26.0–34.0)
MCHC: 35.5 g/dL (ref 30.0–36.0)
MCV: 90.5 fL (ref 80.0–100.0)
Platelets: 261 10*3/uL (ref 150–400)
RBC: 4.23 MIL/uL (ref 4.22–5.81)
RDW: 12.5 % (ref 11.5–15.5)
WBC: 5.9 10*3/uL (ref 4.0–10.5)
nRBC: 0 % (ref 0.0–0.2)

## 2021-12-04 LAB — COMPREHENSIVE METABOLIC PANEL
ALT: 22 U/L (ref 0–44)
AST: 18 U/L (ref 15–41)
Albumin: 4.1 g/dL (ref 3.5–5.0)
Alkaline Phosphatase: 55 U/L (ref 38–126)
Anion gap: 8 (ref 5–15)
BUN: 11 mg/dL (ref 8–23)
CO2: 21 mmol/L — ABNORMAL LOW (ref 22–32)
Calcium: 8.8 mg/dL — ABNORMAL LOW (ref 8.9–10.3)
Chloride: 104 mmol/L (ref 98–111)
Creatinine, Ser: 1.25 mg/dL — ABNORMAL HIGH (ref 0.61–1.24)
GFR, Estimated: >60 mL/min (ref >60)
Glucose, Bld: 135 mg/dL — ABNORMAL HIGH (ref 70–99)
Potassium: 3.5 mmol/L (ref 3.5–5.1)
Sodium: 133 mmol/L — ABNORMAL LOW (ref 135–145)
Total Bilirubin: 1.1 mg/dL (ref 0.3–1.2)
Total Protein: 7.6 g/dL (ref 6.5–8.1)

## 2021-12-04 LAB — TROPONIN I (HIGH SENSITIVITY)
Troponin I (High Sensitivity): 35 ng/L — ABNORMAL HIGH (ref <18)
Troponin I (High Sensitivity): 35 ng/L — ABNORMAL HIGH (ref <18)

## 2021-12-04 LAB — LIPASE, BLOOD: Lipase: 40 U/L (ref 11–51)

## 2021-12-04 MED ORDER — LIDOCAINE VISCOUS HCL 2 % MT SOLN
15.0000 mL | Freq: Once | OROMUCOSAL | Status: AC
  Administered 2021-12-04: 15 mL via ORAL
  Filled 2021-12-04: qty 15

## 2021-12-04 MED ORDER — MORPHINE SULFATE (PF) 4 MG/ML IV SOLN
4.0000 mg | Freq: Once | INTRAVENOUS | Status: AC
  Administered 2021-12-04: 4 mg via INTRAVENOUS
  Filled 2021-12-04: qty 1

## 2021-12-04 MED ORDER — ALUM & MAG HYDROXIDE-SIMETH 200-200-20 MG/5ML PO SUSP
30.0000 mL | Freq: Once | ORAL | Status: AC
  Administered 2021-12-04: 30 mL via ORAL
  Filled 2021-12-04: qty 30

## 2021-12-04 MED ORDER — ONDANSETRON HCL 4 MG/2ML IJ SOLN
4.0000 mg | Freq: Once | INTRAMUSCULAR | Status: AC
  Administered 2021-12-04: 4 mg via INTRAVENOUS
  Filled 2021-12-04: qty 2

## 2021-12-04 MED ORDER — IOHEXOL 300 MG/ML  SOLN
100.0000 mL | Freq: Once | INTRAMUSCULAR | Status: AC | PRN
  Administered 2021-12-04: 100 mL via INTRAVENOUS

## 2021-12-04 MED ORDER — ASPIRIN 325 MG PO TABS
325.0000 mg | ORAL_TABLET | Freq: Once | ORAL | Status: AC
  Administered 2021-12-04: 325 mg via ORAL
  Filled 2021-12-04: qty 1

## 2021-12-04 MED ORDER — LACTATED RINGERS IV BOLUS
500.0000 mL | Freq: Once | INTRAVENOUS | Status: AC
  Administered 2021-12-04: 500 mL via INTRAVENOUS

## 2021-12-04 NOTE — ED Provider Notes (Signed)
[facility] Provider Note   CSN: [id number] Arrival date & time: 12/04/21  1640     History {Add pertinent medical, surgical, social history, OB history to HPI:1} Chief Complaint  Patient presents with   Abdominal Pain    Epigastric    Joshua Solis is a 76 y.o. male.  76 year old male presents today for evaluation of epigastric abdominal pain which has been ongoing for about 6 days.  He states this started after he ate spicy meal lab [employer].  He states this immediately made him vomit, he has since then been experiencing abdominal pain, nausea, and vomiting.  Pain radiates to his mid back.  He does have history of hyperlipidemia, hypertension.  He denies chest pain, or shortness of breath.  The history is provided by the patient. No language interpreter was used.       Home Medications Prior to Admission medications   Medication Sig Start Date End Date Taking? Authorizing Provider  amLODipine (NORVASC) 10 MG tablet Take 1 tablet (10 mg total) by mouth daily. Patient taking differently: Take 10 mg by mouth daily. 10/07/19   Minus Breeding, MD  aspirin (ASPIRIN LOW DOSE) 81 MG EC tablet TAKE 1 TABLET(81 MG) BY MOUTH DAILY Patient taking differently: 81 mg daily. TAKE 1 TABLET(81 MG) BY MOUTH DAILY 12/22/20   Minus Breeding, MD  atorvastatin (LIPITOR) 80 MG tablet Take 1 tablet (80 mg total) by mouth at bedtime. 12/22/20   Minus Breeding, MD  calcium carbonate (TUMS - DOSED IN MG ELEMENTAL CALCIUM) 500 MG chewable tablet Chew 1 tablet by mouth daily.    [provider]  cetirizine (ZYRTEC) 10 MG tablet Take 10 mg by mouth daily.    [provider]  ibuprofen (ADVIL,MOTRIN) 200 MG tablet Take 400 mg by mouth every 6 (six) hours as needed for moderate pain.    [provider]  metoprolol tartrate (LOPRESSOR) 25 MG tablet Take 25 mg by mouth 2 (two) times daily.    [provider]  nitroGLYCERIN (NITROSTAT) 0.4 MG SL  tablet Place 0.4 mg under the tongue every 5 (five) minutes as needed for chest pain (x 3 doses). Patient not taking: Reported on 02/02/2021    [provider]  omeprazole (PRILOSEC) 20 MG capsule Take 1 capsule (20 mg total) by mouth daily. Patient taking differently: Take 20 mg by mouth daily as needed. 08/30/19   Tegeler, Gwenyth Allegra, MD  oxyCODONE-acetaminophen (PERCOCET) 5-325 MG tablet Take 1 tablet by mouth every 4 (four) hours as needed for severe pain. 02/07/21 02/07/22  Stechschulte, Nickola Major, MD  sertraline (ZOLOFT) 100 MG tablet Take 100 mg by mouth at bedtime.    [provider]  sildenafil (VIAGRA) 100 MG tablet Take 100 mg by mouth daily as needed for erectile dysfunction.    [provider]  tamsulosin (FLOMAX) 0.4 MG CAPS capsule Take 0.4 mg by mouth at bedtime.    [provider]  terazosin (HYTRIN) 10 MG capsule Take 10 mg by mouth at bedtime.    [provider]  traZODone (DESYREL) 50 MG tablet Take 50 mg by mouth at bedtime.    [provider]      Allergies    Patient has no known allergies.    Review of Systems   Review of Systems  Constitutional:  Negative for chills and fever.  Respiratory:  Negative for shortness of breath.   Cardiovascular:  Negative for chest pain and leg swelling.  Gastrointestinal:  Positive  for abdominal pain, nausea and vomiting.  Genitourinary:  Negative for dysuria.  Musculoskeletal:  Positive for back pain.  Neurological:  Negative for syncope and light-headedness.  All other systems reviewed and are negative.   Physical Exam Updated Vital Signs BP (!) 164/76   Pulse (!) 58   Temp 98.9 F (37.2 C) (Oral)   Resp 17   Ht 5\' 8"  (1.727 m)   Wt 83.9 kg   SpO2 99%   BMI 28.13 kg/m  Physical Exam Vitals and nursing note reviewed.  Constitutional:      General: He is not in acute distress.    Appearance: Normal appearance. He is not ill-appearing.  HENT:     Head: Normocephalic  and atraumatic.     Nose: Nose normal.  Eyes:     General: No scleral icterus.    Extraocular Movements: Extraocular movements intact.     Conjunctiva/sclera: Conjunctivae normal.  Cardiovascular:     Rate and Rhythm: Normal rate and regular rhythm.     Pulses: Normal pulses.  Pulmonary:     Effort: Pulmonary effort is normal. No respiratory distress.     Breath sounds: Normal breath sounds. No wheezing or rales.  Abdominal:     General: There is no distension.     Tenderness: There is abdominal tenderness. There is no right CVA tenderness, left CVA tenderness or guarding.  Musculoskeletal:        General: Normal range of motion.     Cervical back: Normal range of motion.     Right lower leg: No edema.     Left lower leg: No edema.  Skin:    General: Skin is warm and dry.  Neurological:     General: No focal deficit present.     Mental Status: He is alert. Mental status is at baseline.     ED Results / Procedures / Treatments   Labs (all labs ordered are listed, but only abnormal results are displayed) Labs Reviewed  COMPREHENSIVE METABOLIC PANEL - Abnormal; Notable for the following components:      Result Value   Sodium 133 (*)    CO2 21 (*)    Glucose, Bld 135 (*)    Creatinine, Ser 1.25 (*)    Calcium 8.8 (*)    All other components within normal limits  CBC - Abnormal; Notable for the following components:   HCT 38.3 (*)    All other components within normal limits  TROPONIN I (HIGH SENSITIVITY) - Abnormal; Notable for the following components:   Troponin I (High Sensitivity) 35 (*)    All other components within normal limits  TROPONIN I (HIGH SENSITIVITY) - Abnormal; Notable for the following components:   Troponin I (High Sensitivity) 35 (*)    All other components within normal limits  LIPASE, BLOOD    EKG EKG Interpretation  Date/Time:  Tuesday December 04 2021 16:53:32 EDT Ventricular Rate:  64 PR Interval:  146 QRS Duration: 104 QT  Interval:  396 QTC Calculation: 408 R Axis:   48 Text Interpretation: Normal sinus rhythm Septal infarct , age undetermined ST & T wave abnormality, consider inferior ischemia Abnormal ECG When compared with ECG of 30-Aug-2019 14:26, TW changes inferior leads new Confirmed by Gareth Morgan [phone]) on 12/04/2021 7:50:36 PM  Radiology DG Chest 2 View  Result Date: 12/04/2021 CLINICAL DATA:  Chest and epigastric pain for several days following eating spicy food, initial encounter EXAM: CHEST - 2 VIEW COMPARISON:  08/30/2019 FINDINGS: Cardiac shadow  is stable. Lungs are clear bilaterally. No bony abnormality is noted. IMPRESSION: No active cardiopulmonary disease. Electronically Signed   By: Inez Catalina M.D.   On: 12/04/2021 19:38   CT ABDOMEN PELVIS W CONTRAST  Result Date: 12/04/2021 CLINICAL DATA:  Epigastric pain. EXAM: CT ABDOMEN AND PELVIS WITH CONTRAST TECHNIQUE: Multidetector CT imaging of the abdomen and pelvis was performed using the standard protocol following bolus administration of intravenous contrast. RADIATION DOSE REDUCTION: This exam was performed according to the departmental dose-optimization program which includes automated exposure control, adjustment of the mA and/or kV according to patient size and/or use of iterative reconstruction technique. CONTRAST:  184mL OMNIPAQUE IOHEXOL 300 MG/ML  SOLN COMPARISON:  CT abdomen and pelvis 06/29/2020 FINDINGS: Lower chest: No acute abnormality. Hepatobiliary: There are rounded hypodensities in the liver which are too small to characterize, likely cysts. These measure up to 1 cm and appear unchanged from prior. No new liver lesions are seen. Gallbladder and bile ducts are within normal limits. Pancreas: Unremarkable. No pancreatic ductal dilatation or surrounding inflammatory changes. Spleen: Normal in size without focal abnormality. Adrenals/Urinary Tract: Rounded cortical hypodensities in both kidneys are too small to characterize, likely  cysts. Otherwise, the kidneys, adrenal glands and bladder are within normal limits. Stomach/Bowel: Stomach is within normal limits. Appendix appears normal. No evidence of bowel wall thickening, distention, or inflammatory changes. There is diffuse colonic diverticulosis. Vascular/Lymphatic: Aortic atherosclerosis. No enlarged abdominal or pelvic lymph nodes. Reproductive: Prostate gland is enlarged measuring 6.4 by 4.8 by 4.9 cm. Other: There has been interval repair of umbilical hernia. No recurrent hernia. No ascites. Musculoskeletal: There are degenerative changes of the lower lumbar spine. IMPRESSION: 1. No acute localizing process in the abdomen or pelvis. 2. Colonic diverticulosis without evidence for diverticulitis. 3. Interval repair of umbilical hernia. No recurrent hernia. 4. Prostatomegaly. 5. Subcentimeter Bosniak II renal cyst, too small to characterize. No follow-up imaging is recommended. JACR 2018 Feb; 264-273, Management of the Incidental RenalMass on CT, [employer] 2021; 814-848, Bosniak Classification of Cystic Renal Masses, Version 2019. Aortic Atherosclerosis (ICD10-I70.0). Electronically Signed   By: Ronney Asters M.D.   On: 12/04/2021 19:37    Procedures Procedures  {Document cardiac monitor, telemetry assessment procedure when appropriate:1}  Medications Ordered in ED Medications  lactated ringers bolus 500 mL (0 mLs Intravenous Stopped 12/04/21 2015)  aspirin tablet 325 mg (325 mg Oral Given 12/04/21 1835)  morphine (PF) 4 MG/ML injection 4 mg (4 mg Intravenous Given 12/04/21 1848)  ondansetron (ZOFRAN) injection 4 mg (4 mg Intravenous Given 12/04/21 1846)  iohexol (OMNIPAQUE) 300 MG/ML solution 100 mL (100 mLs Intravenous Contrast Given 12/04/21 1911)  alum & mag hydroxide-simeth (MAALOX/MYLANTA) 200-200-20 MG/5ML suspension 30 mL (30 mLs Oral Given 12/04/21 2015)    And  lidocaine (XYLOCAINE) 2 % viscous mouth solution 15 mL (15 mLs Oral Given 12/04/21 2015)    ED Course/  Medical Decision Making/ A&P                           Medical Decision Making Amount and/or Complexity of Data Reviewed Labs: ordered. Radiology: ordered.  Risk OTC drugs. Prescription drug management.   Medical Decision Making / ED Course   This patient presents to the ED for concern of epigastric abdominal pain, this involves an extensive number of treatment options, and is a complaint that carries with it a high risk of complications and morbidity.  The differential diagnosis includes pancreatitis, cholecystitis, gastritis, ACS, PE,  pneumonia  MDM: 76 year old male presents with above-mentioned complaints.  He does have some cardiac risk factors with hyperlipidemia, hypertension.  Previously underwent stress testing in 2017 which outside of hypertensive response did not show any concerning findings.  He has had multiple echoes most recently in 2022 which showed preserved EF.  Work-up so far shows CBC without leukocytosis or anemia.  CMP shows mild renal insufficiency, glucose of 135 otherwise without acute concerns.  Normal LFTs.  Lipase within normal limits.  Troponin initially was 35.  Repeat pending.  We will add on chest x-ray, CT abdomen pelvis with contrast.  EKG does show T wave inversions, nonspecific changes since previous.  However he remains without chest pain on exam. CT abdomen pelvis without acute intra-abdominal finding.  Given abdominal tenderness, and clinical picture will obtain right upper quadrant ultrasound.  Will provide GI cocktail.  Repeat troponin 35 and flat.   Additional history obtained: -Additional history obtained from *** -External records from outside source obtained and reviewed including: Chart review including previous notes, labs, imaging, consultation notes   Lab Tests: -I ordered, reviewed, and interpreted labs.   The pertinent results include:   Labs Reviewed  COMPREHENSIVE METABOLIC PANEL - Abnormal; Notable for the following components:       Result Value   Sodium 133 (*)    CO2 21 (*)    Glucose, Bld 135 (*)    Creatinine, Ser 1.25 (*)    Calcium 8.8 (*)    All other components within normal limits  CBC - Abnormal; Notable for the following components:   HCT 38.3 (*)    All other components within normal limits  TROPONIN I (HIGH SENSITIVITY) - Abnormal; Notable for the following components:   Troponin I (High Sensitivity) 35 (*)    All other components within normal limits  TROPONIN I (HIGH SENSITIVITY) - Abnormal; Notable for the following components:   Troponin I (High Sensitivity) 35 (*)    All other components within normal limits  LIPASE, BLOOD      EKG  EKG Interpretation  Date/Time:  Tuesday December 04 2021 16:53:32 EDT Ventricular Rate:  64 PR Interval:  146 QRS Duration: 104 QT Interval:  396 QTC Calculation: 408 R Axis:   48 Text Interpretation: Normal sinus rhythm Septal infarct , age undetermined ST & T wave abnormality, consider inferior ischemia Abnormal ECG When compared with ECG of 30-Aug-2019 14:26, TW changes inferior leads new Confirmed by Gareth Morgan [phone]) on 12/04/2021 7:50:36 PM         Imaging Studies ordered: I ordered imaging studies including *** I independently visualized and interpreted imaging. I agree with the radiologist interpretation   Medicines ordered and prescription drug management: Meds ordered this encounter  Medications   lactated ringers bolus 500 mL   aspirin tablet 325 mg   morphine (PF) 4 MG/ML injection 4 mg   ondansetron (ZOFRAN) injection 4 mg   iohexol (OMNIPAQUE) 300 MG/ML solution 100 mL   AND Linked Order Group    alum & mag hydroxide-simeth (MAALOX/MYLANTA) 200-200-20 MG/5ML suspension 30 mL    lidocaine (XYLOCAINE) 2 % viscous mouth solution 15 mL    -I have reviewed the patients home medicines and have made adjustments as needed  Critical interventions ***  Consultations Obtained: I requested consultation with the ***,  and  discussed lab and imaging findings as well as pertinent plan - they recommend: ***   Cardiac Monitoring: The patient was maintained on a cardiac monitor.  I personally  viewed and interpreted the cardiac monitored which showed an underlying rhythm of: ***  Social Determinants of Health:  Factors impacting patients care include: ***   Reevaluation: After the interventions noted above, I reevaluated the patient and found that they have :{resolved/improved/worsened:23923::"improved"}  Co morbidities that complicate the patient evaluation  Past Medical History:  Diagnosis Date   Alcohol abuse    02-02-2021  pt stated drinks 2 beers daily (12 oz) and one pint of gin daily   Arthritis    BPH associated with nocturia    CKD (chronic kidney disease), stage II    a. Cr 1.3 during 09/2015 adm, chronicity unclear at that time; f/u value 1.18.   Diverticulosis of colon    ED (erectile dysfunction)    Fatty liver    GERD (gastroesophageal reflux disease)    History of gastric ulcer    remote hx   Hyperlipidemia    Hypertensive heart disease without heart failure    cardiologist-- dr hochrein;   ETT 08-18-2015 in epic, no ischemia and hx stress test @ [facility] per cardiology note small reversible inferior defect;  echo 12-11-2020 in epic ef 60-65%   MDD (major depressive disorder)    PTSD (post-traumatic stress disorder)    Umbilical hernia       Dispostion: ***     Final Clinical Impression(s) / ED Diagnoses Final diagnoses:  None     @PCDICTATION @   {Document critical care time when appropriate:1} {Document review of labs and clinical decision tools ie heart score, Chads2Vasc2 etc:1}  {Document your independent review of radiology images, and any outside records:1} {Document your discussion with family members, caretakers, and with consultants:1} {Document social determinants of health affecting pt's care:1} {Document your decision making why or why not admission, treatments  were needed:1} Final Clinical Impression(s) / ED Diagnoses Final diagnoses:  None    Rx / DC Orders ED Discharge Orders     None

## 2021-12-04 NOTE — ED Triage Notes (Signed)
Epigastric pain x 6 days. Radiation to mid back. N/V and increased pain with PO intake. Pain started after eating spicy chicken wings.

## 2021-12-05 DIAGNOSIS — K209 Esophagitis, unspecified without bleeding: Secondary | ICD-10-CM | POA: Diagnosis not present

## 2021-12-05 DIAGNOSIS — Z79899 Other long term (current) drug therapy: Secondary | ICD-10-CM | POA: Diagnosis not present

## 2021-12-05 DIAGNOSIS — N182 Chronic kidney disease, stage 2 (mild): Secondary | ICD-10-CM | POA: Diagnosis not present

## 2021-12-05 DIAGNOSIS — Z7982 Long term (current) use of aspirin: Secondary | ICD-10-CM | POA: Diagnosis not present

## 2021-12-05 DIAGNOSIS — E876 Hypokalemia: Secondary | ICD-10-CM | POA: Diagnosis not present

## 2021-12-05 DIAGNOSIS — R778 Other specified abnormalities of plasma proteins: Secondary | ICD-10-CM | POA: Diagnosis not present

## 2021-12-05 DIAGNOSIS — D649 Anemia, unspecified: Secondary | ICD-10-CM | POA: Diagnosis not present

## 2021-12-05 DIAGNOSIS — R1013 Epigastric pain: Secondary | ICD-10-CM | POA: Diagnosis present

## 2021-12-05 DIAGNOSIS — I1310 Hypertensive heart and chronic kidney disease without heart failure, with stage 1 through stage 4 chronic kidney disease, or unspecified chronic kidney disease: Secondary | ICD-10-CM | POA: Diagnosis not present

## 2021-12-05 DIAGNOSIS — K2970 Gastritis, unspecified, without bleeding: Secondary | ICD-10-CM | POA: Diagnosis not present

## 2021-12-05 DIAGNOSIS — R079 Chest pain, unspecified: Secondary | ICD-10-CM

## 2021-12-05 DIAGNOSIS — K760 Fatty (change of) liver, not elsewhere classified: Secondary | ICD-10-CM | POA: Diagnosis not present

## 2021-12-05 DIAGNOSIS — K269 Duodenal ulcer, unspecified as acute or chronic, without hemorrhage or perforation: Secondary | ICD-10-CM | POA: Diagnosis not present

## 2021-12-05 DIAGNOSIS — I2510 Atherosclerotic heart disease of native coronary artery without angina pectoris: Secondary | ICD-10-CM | POA: Diagnosis not present

## 2021-12-05 LAB — CBC
HCT: 41.9 % (ref 39.0–52.0)
Hemoglobin: 14.5 g/dL (ref 13.0–17.0)
MCH: 31.7 pg (ref 26.0–34.0)
MCHC: 34.6 g/dL (ref 30.0–36.0)
MCV: 91.7 fL (ref 80.0–100.0)
Platelets: 289 10*3/uL (ref 150–400)
RBC: 4.57 MIL/uL (ref 4.22–5.81)
RDW: 12.5 % (ref 11.5–15.5)
WBC: 6.2 10*3/uL (ref 4.0–10.5)
nRBC: 0 % (ref 0.0–0.2)

## 2021-12-05 LAB — CREATININE, SERUM
Creatinine, Ser: 1.12 mg/dL (ref 0.61–1.24)
GFR, Estimated: >60 mL/min (ref >60)

## 2021-12-05 LAB — LIPID PANEL
Cholesterol: 213 mg/dL — ABNORMAL HIGH (ref 0–200)
HDL: 42 mg/dL (ref >40)
LDL Cholesterol: 151 mg/dL — ABNORMAL HIGH (ref 0–99)
Total CHOL/HDL Ratio: 5.1 RATIO
Triglycerides: 101 mg/dL (ref <150)
VLDL: 20 mg/dL (ref 0–40)

## 2021-12-05 LAB — HEMOGLOBIN A1C
Hgb A1c MFr Bld: 5.3 % (ref 4.8–5.6)
Mean Plasma Glucose: 105.41 mg/dL

## 2021-12-05 LAB — TROPONIN I (HIGH SENSITIVITY)
Troponin I (High Sensitivity): 35 ng/L — ABNORMAL HIGH (ref <18)
Troponin I (High Sensitivity): 35 ng/L — ABNORMAL HIGH (ref <18)

## 2021-12-05 MED ORDER — SODIUM CHLORIDE 0.9 % IV SOLN: INTRAVENOUS | Status: DC

## 2021-12-05 MED ORDER — MORPHINE SULFATE (PF) 4 MG/ML IV SOLN
4.0000 mg | INTRAVENOUS | Status: DC | PRN
  Administered 2021-12-05 (×2): 4 mg via INTRAVENOUS
  Filled 2021-12-05 (×2): qty 1

## 2021-12-05 MED ORDER — IPRATROPIUM-ALBUTEROL 0.5-2.5 (3) MG/3ML IN SOLN: 3.0000 mL | RESPIRATORY_TRACT | Status: DC | PRN

## 2021-12-05 MED ORDER — OXYCODONE-ACETAMINOPHEN 5-325 MG PO TABS
1.0000 | ORAL_TABLET | ORAL | Status: DC | PRN
  Administered 2021-12-06: 1 via ORAL
  Filled 2021-12-05: qty 1

## 2021-12-05 MED ORDER — ASPIRIN 81 MG PO TBEC
81.0000 mg | DELAYED_RELEASE_TABLET | Freq: Every day | ORAL | Status: DC
  Administered 2021-12-06 – 2021-12-07 (×2): 81 mg via ORAL
  Filled 2021-12-05 (×2): qty 1

## 2021-12-05 MED ORDER — SUCRALFATE 1 GM/10ML PO SUSP
1.0000 g | Freq: Three times a day (TID) | ORAL | Status: DC
  Administered 2021-12-05 – 2021-12-06 (×2): 1 g via ORAL
  Filled 2021-12-05 (×2): qty 10

## 2021-12-05 MED ORDER — ONDANSETRON HCL 4 MG PO TABS: 4.0000 mg | ORAL_TABLET | Freq: Four times a day (QID) | ORAL | Status: DC | PRN

## 2021-12-05 MED ORDER — SENNOSIDES-DOCUSATE SODIUM 8.6-50 MG PO TABS: 1.0000 | ORAL_TABLET | Freq: Every evening | ORAL | Status: DC | PRN

## 2021-12-05 MED ORDER — SERTRALINE HCL 100 MG PO TABS
100.0000 mg | ORAL_TABLET | Freq: Every day | ORAL | Status: DC
  Administered 2021-12-05 – 2021-12-06 (×2): 100 mg via ORAL
  Filled 2021-12-05 (×2): qty 1

## 2021-12-05 MED ORDER — TERAZOSIN HCL 5 MG PO CAPS
10.0000 mg | ORAL_CAPSULE | Freq: Every day | ORAL | Status: DC
  Administered 2021-12-05 – 2021-12-06 (×2): 10 mg via ORAL
  Filled 2021-12-05 (×3): qty 2

## 2021-12-05 MED ORDER — ATORVASTATIN CALCIUM 80 MG PO TABS
80.0000 mg | ORAL_TABLET | Freq: Every day | ORAL | Status: DC
  Administered 2021-12-05 – 2021-12-06 (×2): 80 mg via ORAL
  Filled 2021-12-05 (×2): qty 1

## 2021-12-05 MED ORDER — AMLODIPINE BESYLATE 10 MG PO TABS
10.0000 mg | ORAL_TABLET | Freq: Every day | ORAL | Status: DC
  Administered 2021-12-06 – 2021-12-07 (×2): 10 mg via ORAL
  Filled 2021-12-05 (×3): qty 1

## 2021-12-05 MED ORDER — TAMSULOSIN HCL 0.4 MG PO CAPS
0.4000 mg | ORAL_CAPSULE | Freq: Every day | ORAL | Status: DC
  Administered 2021-12-05 – 2021-12-06 (×2): 0.4 mg via ORAL
  Filled 2021-12-05 (×2): qty 1

## 2021-12-05 MED ORDER — ONDANSETRON HCL 4 MG/2ML IJ SOLN
4.0000 mg | Freq: Once | INTRAMUSCULAR | Status: AC
  Administered 2021-12-05: 4 mg via INTRAVENOUS
  Filled 2021-12-05: qty 2

## 2021-12-05 MED ORDER — PANTOPRAZOLE SODIUM 40 MG IV SOLR
40.0000 mg | Freq: Two times a day (BID) | INTRAVENOUS | Status: DC
  Administered 2021-12-05 – 2021-12-07 (×4): 40 mg via INTRAVENOUS
  Filled 2021-12-05 (×3): qty 10

## 2021-12-05 MED ORDER — ONDANSETRON HCL 4 MG/2ML IJ SOLN: 4.0000 mg | Freq: Four times a day (QID) | INTRAMUSCULAR | Status: DC | PRN

## 2021-12-05 MED ORDER — GUAIFENESIN 100 MG/5ML PO LIQD: 5.0000 mL | ORAL | Status: DC | PRN

## 2021-12-05 MED ORDER — MORPHINE SULFATE (PF) 4 MG/ML IV SOLN
4.0000 mg | Freq: Once | INTRAVENOUS | Status: AC
  Administered 2021-12-05: 4 mg via INTRAVENOUS
  Filled 2021-12-05: qty 1

## 2021-12-05 MED ORDER — METOPROLOL TARTRATE 5 MG/5ML IV SOLN: 5.0000 mg | INTRAVENOUS | Status: DC | PRN

## 2021-12-05 MED ORDER — HEPARIN SODIUM (PORCINE) 5000 UNIT/ML IJ SOLN
5000.0000 [IU] | Freq: Three times a day (TID) | INTRAMUSCULAR | Status: DC
  Administered 2021-12-05 – 2021-12-06 (×2): 5000 [IU] via SUBCUTANEOUS
  Filled 2021-12-05 (×2): qty 1

## 2021-12-05 MED ORDER — TRAZODONE HCL 50 MG PO TABS
50.0000 mg | ORAL_TABLET | Freq: Every day | ORAL | Status: DC
  Administered 2021-12-05 – 2021-12-06 (×2): 50 mg via ORAL
  Filled 2021-12-05 (×2): qty 1

## 2021-12-05 MED ORDER — METOPROLOL TARTRATE 25 MG PO TABS
25.0000 mg | ORAL_TABLET | Freq: Two times a day (BID) | ORAL | Status: DC
  Administered 2021-12-06: 25 mg via ORAL
  Filled 2021-12-05 (×2): qty 1

## 2021-12-05 MED ORDER — HYDRALAZINE HCL 20 MG/ML IJ SOLN: 10.0000 mg | INTRAMUSCULAR | Status: DC | PRN

## 2021-12-05 MED ORDER — ACETAMINOPHEN 325 MG PO TABS: 650.0000 mg | ORAL_TABLET | Freq: Four times a day (QID) | ORAL | Status: DC | PRN

## 2021-12-05 NOTE — ED Notes (Signed)
Pt requesting pain medicine; when this RN went back into room to let pt know there is morphine for pain, he was asleep. Will hold off on narcotic at this time until pt wakes up.

## 2021-12-05 NOTE — H&P (Addendum)
History and Physical    Joshua Solis [id number] DOB: 05-07-1945 DOA: 12/04/2021  PCP: [facility] Patient coming from: from [place]: Epic gastric pain  HPI: Joshua Solis is a 76 y.o. male with medical history significant of HTN, CKD stage II, GERD, history of H. pylori, MDD, BPH comes to the hospital at [facility] with complaints of epigastric abdominal pain.  Patient states he had [employer] last Wednesday followed by epigastric abdominal pain, nausea and vomiting.  Later that day his pain subsided therefore did not seek any medical attention.  Denies any fevers and chills.  Over the last couple of days his pain and discomfort started coming back therefore comes to the hospital.  Denies any hematemesis, hematochezia and other complaints.  Upon further questioning he has had H. pylori gastritis in 2020 treated with PPI.  This was diagnosed in [place].  [place] has CT abdomen pelvis did not show any acute pathology, LFTs and lipase were normal.  Creatinine at baseline of 1.2.  Right upper quadrant ultrasound showed small liver cyst.  Troponin was 35, EKG was unremarkable.  Case was discussed by EDP with cardiology who recommended ruling out ACS.  When I saw the patient he was comfortable, did not have any new complaints  Social history-denies any tobacco, illicit drug use.  Drinks maybe 2 beers weekly   Review of Systems: As per HPI otherwise 10 point review of systems negative.  Review of Systems Otherwise negative except as per HPI, including: General: Denies fever, chills, night sweats or unintended weight loss. Resp: Denies cough, wheezing, shortness of breath. Cardiac: Denies chest pain, palpitations, orthopnea, paroxysmal nocturnal dyspnea. GI: Denies diarrhea or constipation GU: Denies dysuria, frequency, hesitancy or incontinence MS: Denies muscle aches, joint pain or swelling Neuro: Denies  headache, neurologic deficits (focal weakness, numbness, tingling), abnormal gait Psych: Denies anxiety, depression, SI/HI/AVH Skin: Denies new rashes or lesions ID: Denies sick contacts, exotic exposures, travel  Past Medical History:  Diagnosis Date   Alcohol abuse    02-02-2021  pt stated drinks 2 beers daily (12 oz) and one pint of gin daily   Arthritis    BPH associated with nocturia    CKD (chronic kidney disease), stage II    a. Cr 1.3 during 09/2015 adm, chronicity unclear at that time; f/u value 1.18.   Diverticulosis of colon    ED (erectile dysfunction)    Fatty liver    GERD (gastroesophageal reflux disease)    History of gastric ulcer    remote hx   Hyperlipidemia    Hypertensive heart disease without heart failure    cardiologist-- dr hochrein;   ETT 08-18-2015 in epic, no ischemia and hx stress test @ [facility] per cardiology note small reversible inferior defect;  echo 12-11-2020 in epic ef 60-65%   MDD (major depressive disorder)    PTSD (post-traumatic stress disorder)    Umbilical hernia     Past Surgical History:  Procedure Laterality Date   INGUINAL HERNIA REPAIR Left 2000   KNEE ARTHROSCOPY W/ MENISCAL REPAIR Right 08/11/2020   @[facility]    UMBILICAL HERNIA REPAIR N/A 02/07/2021   Procedure: HERNIA REPAIR UMBILICAL ADULT WITH MESH;  Surgeon: Stechschulte, Nickola Major, MD;  Location: [facility];  Service: General;  Laterality: N/A;   VITRECTOMY Bilateral 2017   approx    SOCIAL HISTORY:  reports that he has never smoked. He has never used smokeless tobacco. He  reports current alcohol use of about 84.0 standard drinks of alcohol per week. He reports that he does not use drugs.  No Known Allergies  FAMILY HISTORY: Family History  Problem Relation Age of Onset   Stroke Mother 38   Pneumonia Mother      Prior to Admission medications   Medication Sig Start Date End Date Taking? Authorizing Provider  amLODipine (NORVASC) 10 MG tablet Take 1 tablet (10  mg total) by mouth daily. Patient taking differently: Take 10 mg by mouth daily. 10/07/19   Minus Breeding, MD  aspirin (ASPIRIN LOW DOSE) 81 MG EC tablet TAKE 1 TABLET(81 MG) BY MOUTH DAILY Patient taking differently: 81 mg daily. TAKE 1 TABLET(81 MG) BY MOUTH DAILY 12/22/20   Minus Breeding, MD  atorvastatin (LIPITOR) 80 MG tablet Take 1 tablet (80 mg total) by mouth at bedtime. 12/22/20   Minus Breeding, MD  calcium carbonate (TUMS - DOSED IN MG ELEMENTAL CALCIUM) 500 MG chewable tablet Chew 1 tablet by mouth daily.    [provider]  cetirizine (ZYRTEC) 10 MG tablet Take 10 mg by mouth daily.    [provider]  ibuprofen (ADVIL,MOTRIN) 200 MG tablet Take 400 mg by mouth every 6 (six) hours as needed for moderate pain.    [provider]  metoprolol tartrate (LOPRESSOR) 25 MG tablet Take 25 mg by mouth 2 (two) times daily.    [provider]  nitroGLYCERIN (NITROSTAT) 0.4 MG SL tablet Place 0.4 mg under the tongue every 5 (five) minutes as needed for chest pain (x 3 doses). Patient not taking: Reported on 02/02/2021    [provider]  omeprazole (PRILOSEC) 20 MG capsule Take 1 capsule (20 mg total) by mouth daily. Patient taking differently: Take 20 mg by mouth daily as needed. 08/30/19   Tegeler, Gwenyth Allegra, MD  oxyCODONE-acetaminophen (PERCOCET) 5-325 MG tablet Take 1 tablet by mouth every 4 (four) hours as needed for severe pain. 02/07/21 02/07/22  Stechschulte, Nickola Major, MD  sertraline (ZOLOFT) 100 MG tablet Take 100 mg by mouth at bedtime.    [provider]  sildenafil (VIAGRA) 100 MG tablet Take 100 mg by mouth daily as needed for erectile dysfunction.    [provider]  tamsulosin (FLOMAX) 0.4 MG CAPS capsule Take 0.4 mg by mouth at bedtime.    [provider]  terazosin (HYTRIN) 10 MG capsule Take 10 mg by mouth at bedtime.    [provider]  traZODone (DESYREL) 50 MG tablet Take 50 mg by mouth at  bedtime.    [provider]    Physical Exam: Vitals:   12/05/21 1200 12/05/21 1400 12/05/21 1540 12/05/21 1635  BP: (!) 169/72 (!) 166/82 (!) 164/81 (!) 176/80  Pulse: (!) 59 (!) 59 66 77  Resp: 18 17 13 16   Temp:  98.3 F (36.8 C)  98.3 F (36.8 C)  TempSrc:  Oral  Oral  SpO2: 99% 95% 99%   Weight:      Height:          Constitutional: NAD, calm, comfortable Eyes: PERRL, lids and conjunctivae normal ENMT: Mucous membranes are moist. Posterior pharynx clear of any exudate or lesions.Normal dentition.  Neck: normal, supple, no masses, no thyromegaly Respiratory: clear to auscultation bilaterally, no wheezing, no crackles. Normal respiratory effort. No accessory muscle use.  Cardiovascular: Regular rate and rhythm, no murmurs / rubs / gallops. No extremity edema. 2+ pedal pulses. No carotid bruits.  Abdomen: no tenderness, no masses palpated.  No hepatosplenomegaly. Bowel sounds positive.  Musculoskeletal: no clubbing / cyanosis. No joint deformity upper and lower extremities. Good ROM, no contractures. Normal muscle tone.  Skin: no rashes, lesions, ulcers. No induration Neurologic: CN 2-12 grossly intact. Sensation intact, DTR normal. Strength 5/5 in all 4.  Psychiatric: Normal judgment and insight. Alert and oriented x 3. Normal mood.     Labs on Admission: I have personally reviewed following labs and imaging studies  CBC: Recent Labs  Lab 12/04/21 1648  WBC 5.9  HGB 13.6  HCT 38.3*  MCV 90.5  PLT 0000000   Basic Metabolic Panel: Recent Labs  Lab 12/04/21 1648  NA 133*  K 3.5  CL 104  CO2 21*  GLUCOSE 135*  BUN 11  CREATININE 1.25*  CALCIUM 8.8*   GFR: Estimated Creatinine Clearance: 53.9 mL/min (A) (by C-G formula based on SCr of 1.25 mg/dL (H)). Liver Function Tests: Recent Labs  Lab 12/04/21 1648  AST 18  ALT 22  ALKPHOS 55  BILITOT 1.1  PROT 7.6  ALBUMIN 4.1   Recent Labs  Lab 12/04/21 1648  LIPASE 40   No results for input(s):  "AMMONIA" in the last 168 hours. Coagulation Profile: No results for input(s): "INR", "PROTIME" in the last 168 hours. Cardiac Enzymes: No results for input(s): "CKTOTAL", "CKMB", "CKMBINDEX", "TROPONINI" in the last 168 hours. BNP (last 3 results) No results for input(s): "PROBNP" in the last 8760 hours. HbA1C: No results for input(s): "HGBA1C" in the last 72 hours. CBG: No results for input(s): "GLUCAP" in the last 168 hours. Lipid Profile: No results for input(s): "CHOL", "HDL", "LDLCALC", "TRIG", "CHOLHDL", "LDLDIRECT" in the last 72 hours. Thyroid Function Tests: No results for input(s): "TSH", "T4TOTAL", "FREET4", "T3FREE", "THYROIDAB" in the last 72 hours. Anemia Panel: No results for input(s): "VITAMINB12", "FOLATE", "FERRITIN", "TIBC", "IRON", "RETICCTPCT" in the last 72 hours. Urine analysis:    Component Value Date/Time   COLORURINE AMBER (A) 06/29/2020 1250   APPEARANCEUR CLEAR 06/29/2020 1250   LABSPEC 1.025 06/29/2020 1250   PHURINE 5.5 06/29/2020 1250   GLUCOSEU NEGATIVE 06/29/2020 1250   HGBUR NEGATIVE 06/29/2020 1250   BILIRUBINUR NEGATIVE 06/29/2020 1250   KETONESUR NEGATIVE 06/29/2020 1250   PROTEINUR NEGATIVE 06/29/2020 1250   NITRITE NEGATIVE 06/29/2020 1250   LEUKOCYTESUR NEGATIVE 06/29/2020 1250   Sepsis Labs: !!!!!!!!!!!!!!!!!!!!!!!!!!!!!!!!!!!!!!!!!!!! @LABRCNTIP (procalcitonin:4,lacticidven:4) )No results found for this or any previous visit (from the past 240 hour(s)).   Radiological Exams on Admission: US Abdomen Limited RUQ (LIVER/GB)  Result Date: 12/04/2021 CLINICAL DATA:  Right upper quadrant pain. EXAM: ULTRASOUND ABDOMEN LIMITED RIGHT UPPER QUADRANT COMPARISON:  None Available. FINDINGS: Gallbladder: No gallstones or wall thickening visualized (1.5 mm). No sonographic Murphy sign noted by sonographer. Common bile duct: Diameter: 2.6 mm Liver: A 1.6 cm cyst is seen within the left lobe of the liver. Diffusely increased echogenicity of the liver  parenchyma is noted. Portal vein is patent on color Doppler imaging with normal direction of blood flow towards the liver. Other: A small left pleural effusion is noted. IMPRESSION: 1. Hepatic steatosis with a 1.6 cm simple hepatic cyst. 2. Small left pleural effusion. Electronically Signed   By: Virgina Norfolk M.D.   On: 12/04/2021 21:49   DG Chest 2 View  Result Date: 12/04/2021 CLINICAL DATA:  Chest and epigastric pain for several days following eating spicy food, initial encounter EXAM: CHEST - 2 VIEW COMPARISON:  08/30/2019 FINDINGS: Cardiac shadow is stable. Lungs are clear bilaterally. No bony abnormality is noted. IMPRESSION: No active  cardiopulmonary disease. Electronically Signed   By: Inez Catalina M.D.   On: 12/04/2021 19:38   CT ABDOMEN PELVIS W CONTRAST  Result Date: 12/04/2021 CLINICAL DATA:  Epigastric pain. EXAM: CT ABDOMEN AND PELVIS WITH CONTRAST TECHNIQUE: Multidetector CT imaging of the abdomen and pelvis was performed using the standard protocol following bolus administration of intravenous contrast. RADIATION DOSE REDUCTION: This exam was performed according to the departmental dose-optimization program which includes automated exposure control, adjustment of the mA and/or kV according to patient size and/or use of iterative reconstruction technique. CONTRAST:  113mL OMNIPAQUE IOHEXOL 300 MG/ML  SOLN COMPARISON:  CT abdomen and pelvis 06/29/2020 FINDINGS: Lower chest: No acute abnormality. Hepatobiliary: There are rounded hypodensities in the liver which are too small to characterize, likely cysts. These measure up to 1 cm and appear unchanged from prior. No new liver lesions are seen. Gallbladder and bile ducts are within normal limits. Pancreas: Unremarkable. No pancreatic ductal dilatation or surrounding inflammatory changes. Spleen: Normal in size without focal abnormality. Adrenals/Urinary Tract: Rounded cortical hypodensities in both kidneys are too small to characterize,  likely cysts. Otherwise, the kidneys, adrenal glands and bladder are within normal limits. Stomach/Bowel: Stomach is within normal limits. Appendix appears normal. No evidence of bowel wall thickening, distention, or inflammatory changes. There is diffuse colonic diverticulosis. Vascular/Lymphatic: Aortic atherosclerosis. No enlarged abdominal or pelvic lymph nodes. Reproductive: Prostate gland is enlarged measuring 6.4 by 4.8 by 4.9 cm. Other: There has been interval repair of umbilical hernia. No recurrent hernia. No ascites. Musculoskeletal: There are degenerative changes of the lower lumbar spine. IMPRESSION: 1. No acute localizing process in the abdomen or pelvis. 2. Colonic diverticulosis without evidence for diverticulitis. 3. Interval repair of umbilical hernia. No recurrent hernia. 4. Prostatomegaly. 5. Subcentimeter Bosniak II renal cyst, too small to characterize. No follow-up imaging is recommended. JACR 2018 Feb; 264-273, Management of the Incidental RenalMass on CT, [employer] 2021; [id number], Bosniak Classification of Cystic Renal Masses, Version 2019. Aortic Atherosclerosis (ICD10-I70.0). Electronically Signed   By: Ronney Asters M.D.   On: 12/04/2021 19:37     All images have been reviewed by me personally.  EKG: Independently reviewed.  Nonischemic  Assessment/Plan Principal Problem:   Chest pain, rule out acute myocardial infarction Active Problems:   Essential hypertension   Hyperlipidemia   LVH (left ventricular hypertrophy)    Epigastric abdominal pain - Currently lipase and LFTs are unremarkable.  Right upper quadrant ultrasound shows benign small liver cyst.  CT abdomen pelvis negative for acute pathology.  Placed him on PPI twice daily, Carafate QID.  Does have history of H. pylori gastritis couple years ago upon endoscopy at [facility].  I have messaged Dr. Havery Moros from gastroenterology for consult tomorrow.  We will make him n.p.o. past midnight, for possible EGD.   Antiemetics as needed.  Elevated troponin - Troponins are mostly flat.  Chest pain-free.  [facility] provider spoke with cardiology who recommended ACS rule out.  EKG unremarkable.  I have sent inbox message to cardiology team to see if they want any further work-up in-house.  Check A1c lipid panel.  Echocardiogram from October 2022 unremarkable  Essential hypertension - On Norvasc, Lopressor.  IV as needed ordered  BPH - Flomax  Hyperlipidemia - On statin.  Also on aspirin at home  Alcohol use - Used to drink heavily but now only couple of beers a week.  Not showing any signs of withdrawal, will continue to monitor  CKD stage II - Baseline creatinine  1.2.  Monitor    DVT prophylaxis: Subcu heparin Code Status: Full code Family Communication: None Consults called: Inbox message sent to cardiology.  Secure chat sent to Dr. Havery Moros from [facility] Admission status: Observation telemetry  Status is: Observation The patient remains OBS appropriate and will d/c before 2 midnights.   Time Spent: 65 minutes.  >50% of the time was devoted to discussing the patients care, assessment, plan and disposition with other care givers along with counseling the patient about the risks and benefits of treatment.    Latron Ribas Arsenio Loader MD [facility]  If 7PM-7AM, please contact night-coverage   12/05/2021, 6:04 PM

## 2021-12-05 NOTE — ED Notes (Signed)
Pt given po fluids and cereal for breakfast.

## 2021-12-05 NOTE — ED Notes (Signed)
Attempted to call report.  Nurse is in a patient room and will return call

## 2021-12-05 NOTE — Plan of Care (Signed)
  Problem: Education: Goal: Knowledge of General Education information will improve Description: Including pain rating scale, medication(s)/side effects and non-pharmacologic comfort measures Outcome: Progressing   Problem: Health Behavior/Discharge Planning: Goal: Ability to manage health-related needs will improve Outcome: Progressing   Problem: Clinical Measurements: Goal: Cardiovascular complication will be avoided Outcome: Progressing   Problem: Pain Managment: Goal: General experience of comfort will improve Outcome: Progressing   Problem: Safety: Goal: Ability to remain free from injury will improve Outcome: Progressing   Problem: Skin Integrity: Goal: Risk for impaired skin integrity will decrease Outcome: Progressing   

## 2021-12-06 ENCOUNTER — Encounter (HOSPITAL_COMMUNITY)
Admission: EM | Disposition: A | Discharge status: Home / Self Care | Payer: Self-pay | Source: Home / Self Care | Attending: Emergency Medicine

## 2021-12-06 ENCOUNTER — Observation Stay (HOSPITAL_BASED_OUTPATIENT_CLINIC_OR_DEPARTMENT_OTHER): Payer: No Typology Code available for payment source

## 2021-12-06 ENCOUNTER — Encounter (HOSPITAL_COMMUNITY): Payer: Self-pay | Admitting: Internal Medicine

## 2021-12-06 ENCOUNTER — Observation Stay (HOSPITAL_COMMUNITY): Payer: No Typology Code available for payment source | Admitting: Anesthesiology

## 2021-12-06 ENCOUNTER — Observation Stay (HOSPITAL_BASED_OUTPATIENT_CLINIC_OR_DEPARTMENT_OTHER): Payer: No Typology Code available for payment source | Admitting: Anesthesiology

## 2021-12-06 DIAGNOSIS — E7800 Pure hypercholesterolemia, unspecified: Secondary | ICD-10-CM | POA: Diagnosis not present

## 2021-12-06 DIAGNOSIS — K209 Esophagitis, unspecified without bleeding: Secondary | ICD-10-CM

## 2021-12-06 DIAGNOSIS — K2970 Gastritis, unspecified, without bleeding: Secondary | ICD-10-CM

## 2021-12-06 DIAGNOSIS — I10 Essential (primary) hypertension: Secondary | ICD-10-CM

## 2021-12-06 DIAGNOSIS — K269 Duodenal ulcer, unspecified as acute or chronic, without hemorrhage or perforation: Secondary | ICD-10-CM

## 2021-12-06 DIAGNOSIS — R1013 Epigastric pain: Secondary | ICD-10-CM

## 2021-12-06 DIAGNOSIS — R079 Chest pain, unspecified: Secondary | ICD-10-CM

## 2021-12-06 DIAGNOSIS — R7989 Other specified abnormal findings of blood chemistry: Secondary | ICD-10-CM

## 2021-12-06 DIAGNOSIS — F418 Other specified anxiety disorders: Secondary | ICD-10-CM

## 2021-12-06 HISTORY — PX: BIOPSY: SHX5522

## 2021-12-06 HISTORY — PX: ESOPHAGOGASTRODUODENOSCOPY (EGD) WITH PROPOFOL: SHX5813

## 2021-12-06 LAB — BASIC METABOLIC PANEL
Anion gap: 7 (ref 5–15)
BUN: 7 mg/dL — ABNORMAL LOW (ref 8–23)
CO2: 22 mmol/L (ref 22–32)
Calcium: 8.7 mg/dL — ABNORMAL LOW (ref 8.9–10.3)
Chloride: 103 mmol/L (ref 98–111)
Creatinine, Ser: 1.1 mg/dL (ref 0.61–1.24)
GFR, Estimated: >60 mL/min (ref >60)
Glucose, Bld: 120 mg/dL — ABNORMAL HIGH (ref 70–99)
Potassium: 3.4 mmol/L — ABNORMAL LOW (ref 3.5–5.1)
Sodium: 132 mmol/L — ABNORMAL LOW (ref 135–145)

## 2021-12-06 LAB — CBC
HCT: 36.9 % — ABNORMAL LOW (ref 39.0–52.0)
Hemoglobin: 12.9 g/dL — ABNORMAL LOW (ref 13.0–17.0)
MCH: 31.8 pg (ref 26.0–34.0)
MCHC: 35 g/dL (ref 30.0–36.0)
MCV: 90.9 fL (ref 80.0–100.0)
Platelets: 263 10*3/uL (ref 150–400)
RBC: 4.06 MIL/uL — ABNORMAL LOW (ref 4.22–5.81)
RDW: 12.4 % (ref 11.5–15.5)
WBC: 5.1 10*3/uL (ref 4.0–10.5)
nRBC: 0 % (ref 0.0–0.2)

## 2021-12-06 LAB — TROPONIN I (HIGH SENSITIVITY): Troponin I (High Sensitivity): 24 ng/L — ABNORMAL HIGH (ref <18)

## 2021-12-06 LAB — MAGNESIUM: Magnesium: 2.1 mg/dL (ref 1.7–2.4)

## 2021-12-06 SURGERY — ESOPHAGOGASTRODUODENOSCOPY (EGD) WITH PROPOFOL
Anesthesia: Monitor Anesthesia Care

## 2021-12-06 MED ORDER — SODIUM CHLORIDE 0.9 % IV SOLN: INTRAVENOUS | Status: DC

## 2021-12-06 MED ORDER — PROPOFOL 10 MG/ML IV BOLUS
INTRAVENOUS | Status: DC | PRN
  Administered 2021-12-06 (×2): 20 mg via INTRAVENOUS

## 2021-12-06 MED ORDER — CARVEDILOL 6.25 MG PO TABS
6.2500 mg | ORAL_TABLET | Freq: Two times a day (BID) | ORAL | Status: DC
  Administered 2021-12-06 – 2021-12-07 (×2): 6.25 mg via ORAL
  Filled 2021-12-06 (×2): qty 1

## 2021-12-06 MED ORDER — POTASSIUM CHLORIDE 10 MEQ/100ML IV SOLN
INTRAVENOUS | Status: AC
  Filled 2021-12-06: qty 100

## 2021-12-06 MED ORDER — POTASSIUM CHLORIDE 10 MEQ/100ML IV SOLN
10.0000 meq | INTRAVENOUS | Status: AC
  Administered 2021-12-06 (×4): 10 meq via INTRAVENOUS
  Filled 2021-12-06 (×3): qty 100

## 2021-12-06 MED ORDER — PROPOFOL 500 MG/50ML IV EMUL
INTRAVENOUS | Status: DC | PRN
  Administered 2021-12-06: 100 ug/kg/min via INTRAVENOUS

## 2021-12-06 SURGICAL SUPPLY — 15 items

## 2021-12-06 NOTE — Progress Notes (Signed)
PROGRESS NOTE    Joshua Solis  [id number] DOB: 1945/04/30 DOA: 12/04/2021 PCP: [facility]   Brief Narrative:  76 y.o. male with medical history significant of HTN, CKD stage II, GERD, history of H. pylori, MDD, BPH comes to the hospital at [facility] with complaints of epigastric abdominal pain.  LFTs and lipase were normal, previous history of H. pylori gastritis therefore GI consulted for endoscopy.  Mildly elevated troponin therefore cardiology following as well.   Assessment & Plan:  Principal Problem:   Chest pain, rule out acute myocardial infarction Active Problems:   Essential hypertension   Hyperlipidemia   LVH (left ventricular hypertrophy)     Epigastric abdominal pain - Currently lipase and LFTs are unremarkable.  Right upper quadrant ultrasound shows benign small liver cyst.  CT abdomen pelvis negative for acute pathology.  Placed him on PPI twice daily, Carafate QID.  Does have history of H. pylori gastritis couple years ago upon endoscopy at [facility].  GI following, will plan for endoscopy   Elevated troponin - Troponins remain flat at this time.  Echocardiogram in October 2022 was unremarkable.  Cardiology consulted.   Essential hypertension - On Norvasc, Lopressor.  IV as needed ordered   BPH - Flomax   Hyperlipidemia - LD L151, already on high-dose statin.  Advised low-fat diet   Alcohol use - Used to drink heavily but now only couple of beers a week.  Not showing any signs of withdrawal, will continue to monitor   CKD stage II - Baseline creatinine 1.2.  Monitor       DVT prophylaxis: Subcu heparin Code Status: Full code Family Communication:    Plans for endoscopy today.  GI.    Body mass index is 28.13 kg/m.         Subjective: Seen and examined at bedside.  Tells me he had intense abdominal pain around 4 AM but since then it has subsided.   Examination:  General exam: Appears calm and comfortable   Respiratory system: Clear to auscultation. Respiratory effort normal. Cardiovascular system: S1 & S2 heard, RRR. No JVD, murmurs, rubs, gallops or clicks. No pedal edema. Gastrointestinal system: Abdomen is nondistended, soft and nontender. No organomegaly or masses felt. Normal bowel sounds heard. Central nervous system: Alert and oriented. No focal neurological deficits. Extremities: Symmetric 5 x 5 power. Skin: No rashes, lesions or ulcers Psychiatry: Judgement and insight appear normal. Mood & affect appropriate.     Objective: Vitals:   12/06/21 0003 12/06/21 0412 12/06/21 0826 12/06/21 0828  BP: (!) 142/71 134/67 (!) 158/72 (!) 158/72  Pulse: (!) 56 61 89 98  Resp: 16 16 18    Temp: 98.5 F (36.9 C) 97.7 F (36.5 C) 98.3 F (36.8 C)   TempSrc: Oral Oral Oral   SpO2: 98% 97%    Weight:      Height:        Intake/Output Summary (Last 24 hours) at 12/06/2021 1106 Last data filed at 12/06/2021 0900 Gross per 24 hour  Intake 810.79 ml  Output 500 ml  Net 310.79 ml   Filed Weights   12/04/21 1643  Weight: 83.9 kg     Data Reviewed:   CBC: Recent Labs  Lab 12/04/21 1648 12/05/21 1814 12/06/21 0207  WBC 5.9 6.2 5.1  HGB 13.6 14.5 12.9*  HCT 38.3* 41.9 36.9*  MCV 90.5 91.7 90.9  PLT 261 289 [id number]   Basic Metabolic Panel: Recent Labs  Lab 12/04/21 1648 12/05/21 1814 12/06/21  0207  NA 133*  --  132*  K 3.5  --  3.4*  CL 104  --  103  CO2 21*  --  22  GLUCOSE 135*  --  120*  BUN 11  --  7*  CREATININE 1.25* 1.12 1.10  CALCIUM 8.8*  --  8.7*  MG  --   --  2.1   GFR: Estimated Creatinine Clearance: 61.2 mL/min (by C-G formula based on SCr of 1.1 mg/dL). Liver Function Tests: Recent Labs  Lab 12/04/21 1648  AST 18  ALT 22  ALKPHOS 55  BILITOT 1.1  PROT 7.6  ALBUMIN 4.1   Recent Labs  Lab 12/04/21 1648  LIPASE 40   No results for input(s): "AMMONIA" in the last 168 hours. Coagulation Profile: No results for input(s): "INR", "PROTIME" in the  last 168 hours. Cardiac Enzymes: No results for input(s): "CKTOTAL", "CKMB", "CKMBINDEX", "TROPONINI" in the last 168 hours. BNP (last 3 results) No results for input(s): "PROBNP" in the last 8760 hours. HbA1C: Recent Labs    12/05/21 1814  HGBA1C 5.3   CBG: No results for input(s): "GLUCAP" in the last 168 hours. Lipid Profile: Recent Labs    12/05/21 1814  CHOL 213*  HDL 42  LDLCALC 151*  TRIG 101  CHOLHDL 5.1   Thyroid Function Tests: No results for input(s): "TSH", "T4TOTAL", "FREET4", "T3FREE", "THYROIDAB" in the last 72 hours. Anemia Panel: No results for input(s): "VITAMINB12", "FOLATE", "FERRITIN", "TIBC", "IRON", "RETICCTPCT" in the last 72 hours. Sepsis Labs: No results for input(s): "PROCALCITON", "LATICACIDVEN" in the last 168 hours.  No results found for this or any previous visit (from the past 240 hour(s)).       Radiology Studies: US Abdomen Limited RUQ (LIVER/GB)  Result Date: 12/04/2021 CLINICAL DATA:  Right upper quadrant pain. EXAM: ULTRASOUND ABDOMEN LIMITED RIGHT UPPER QUADRANT COMPARISON:  None Available. FINDINGS: Gallbladder: No gallstones or wall thickening visualized (1.5 mm). No sonographic Murphy sign noted by sonographer. Common bile duct: Diameter: 2.6 mm Liver: A 1.6 cm cyst is seen within the left lobe of the liver. Diffusely increased echogenicity of the liver parenchyma is noted. Portal vein is patent on color Doppler imaging with normal direction of blood flow towards the liver. Other: A small left pleural effusion is noted. IMPRESSION: 1. Hepatic steatosis with a 1.6 cm simple hepatic cyst. 2. Small left pleural effusion. Electronically Signed   By: Virgina Norfolk M.D.   On: 12/04/2021 21:49   DG Chest 2 View  Result Date: 12/04/2021 CLINICAL DATA:  Chest and epigastric pain for several days following eating spicy food, initial encounter EXAM: CHEST - 2 VIEW COMPARISON:  08/30/2019 FINDINGS: Cardiac shadow is stable. Lungs are clear  bilaterally. No bony abnormality is noted. IMPRESSION: No active cardiopulmonary disease. Electronically Signed   By: Inez Catalina M.D.   On: 12/04/2021 19:38   CT ABDOMEN PELVIS W CONTRAST  Result Date: 12/04/2021 CLINICAL DATA:  Epigastric pain. EXAM: CT ABDOMEN AND PELVIS WITH CONTRAST TECHNIQUE: Multidetector CT imaging of the abdomen and pelvis was performed using the standard protocol following bolus administration of intravenous contrast. RADIATION DOSE REDUCTION: This exam was performed according to the departmental dose-optimization program which includes automated exposure control, adjustment of the mA and/or kV according to patient size and/or use of iterative reconstruction technique. CONTRAST:  133mL OMNIPAQUE IOHEXOL 300 MG/ML  SOLN COMPARISON:  CT abdomen and pelvis 06/29/2020 FINDINGS: Lower chest: No acute abnormality. Hepatobiliary: There are rounded hypodensities in the liver which are  too small to characterize, likely cysts. These measure up to 1 cm and appear unchanged from prior. No new liver lesions are seen. Gallbladder and bile ducts are within normal limits. Pancreas: Unremarkable. No pancreatic ductal dilatation or surrounding inflammatory changes. Spleen: Normal in size without focal abnormality. Adrenals/Urinary Tract: Rounded cortical hypodensities in both kidneys are too small to characterize, likely cysts. Otherwise, the kidneys, adrenal glands and bladder are within normal limits. Stomach/Bowel: Stomach is within normal limits. Appendix appears normal. No evidence of bowel wall thickening, distention, or inflammatory changes. There is diffuse colonic diverticulosis. Vascular/Lymphatic: Aortic atherosclerosis. No enlarged abdominal or pelvic lymph nodes. Reproductive: Prostate gland is enlarged measuring 6.4 by 4.8 by 4.9 cm. Other: There has been interval repair of umbilical hernia. No recurrent hernia. No ascites. Musculoskeletal: There are degenerative changes of the lower  lumbar spine. IMPRESSION: 1. No acute localizing process in the abdomen or pelvis. 2. Colonic diverticulosis without evidence for diverticulitis. 3. Interval repair of umbilical hernia. No recurrent hernia. 4. Prostatomegaly. 5. Subcentimeter Bosniak II renal cyst, too small to characterize. No follow-up imaging is recommended. JACR 2018 Feb; 264-273, Management of the Incidental RenalMass on CT, [employer] 2021; [id number], Bosniak Classification of Cystic Renal Masses, Version 2019. Aortic Atherosclerosis (ICD10-I70.0). Electronically Signed   By: Ronney Asters M.D.   On: 12/04/2021 19:37        Scheduled Meds:  amLODipine  10 mg Oral Daily   aspirin EC  81 mg Oral Daily   atorvastatin  80 mg Oral QHS   metoprolol tartrate  25 mg Oral BID   pantoprazole (PROTONIX) IV  40 mg Intravenous Q12H   sertraline  100 mg Oral QHS   tamsulosin  0.4 mg Oral QHS   terazosin  10 mg Oral QHS   traZODone  50 mg Oral QHS   Continuous Infusions:  sodium chloride 75 mL/hr at 12/06/21 0716   sodium chloride     potassium chloride 10 mEq (12/06/21 1055)     LOS: 0 days   Time spent= 35 mins    Darlinda Bellows Arsenio Loader, MD [facility]  If 7PM-7AM, please contact night-coverage  12/06/2021, 11:06 AM

## 2021-12-06 NOTE — Progress Notes (Signed)
  Echocardiogram 2D Echocardiogram has been performed.  Joshua Solis 12/06/2021, 5:30 PM
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
## 2021-12-06 NOTE — Anesthesia Postprocedure Evaluation (Signed)
Anesthesia Post Note  Patient: Joshua Solis  Procedure(s) Performed: ESOPHAGOGASTRODUODENOSCOPY (EGD) WITH PROPOFOL BIOPSY     Patient location during evaluation: Endoscopy Anesthesia Type: MAC Level of consciousness: awake and alert Pain management: pain level controlled Vital Signs Assessment: post-procedure vital signs reviewed and stable Respiratory status: spontaneous breathing, nonlabored ventilation and respiratory function stable Cardiovascular status: stable and blood pressure returned to baseline Postop Assessment: no apparent nausea or vomiting Anesthetic complications: no   No notable events documented.  Last Vitals:  Vitals:   12/06/21 1447 12/06/21 1457  BP: (!) 96/51 126/66  Pulse: (!) 59 (!) 57  Resp: 16 16  Temp:    SpO2: 98% 98%    Last Pain:  Vitals:   12/06/21 1457  TempSrc:   PainSc: 0-No pain                 Rossetta Kama,W. EDMOND

## 2021-12-06 NOTE — Op Note (Signed)
[facility] Patient Name: Joshua Solis Procedure Date : 12/06/2021 MRN: [id number] Attending MD: Georgian Co ,  Date of Birth: October 05, 1945 CSN: [id number] Age: 76 Admit Type: Inpatient Procedure:                Upper GI endoscopy Indications:              Epigastric abdominal pain, Nausea with vomiting Providers:                Adline Mango" Cherrie Distance, RN, Gloris Ham, Technician Referring MD:             Hospitalist team Medicines:                Monitored Anesthesia Care Complications:            No immediate complications. Estimated Blood Loss:     Estimated blood loss was minimal. Procedure:                Pre-Anesthesia Assessment:                           - Prior to the procedure, a History and Physical                            was performed, and patient medications and                            allergies were reviewed. The patient's tolerance of                            previous anesthesia was also reviewed. The risks                            and benefits of the procedure and the sedation                            options and risks were discussed with the patient.                            All questions were answered, and informed consent                            was obtained. Prior Anticoagulants: The patient has                            taken no previous anticoagulant or antiplatelet                            agents. ASA Grade Assessment: III - A patient with                            severe systemic disease. After reviewing the risks  and benefits, the patient was deemed in                            satisfactory condition to undergo the procedure.                           After obtaining informed consent, the endoscope was                            passed under direct vision. Throughout the                            procedure, the patient's blood pressure, pulse,  and                            oxygen saturations were monitored continuously. The                            [id number]-[id number] ([id number]) Olympus endoscope was introduced                            through the mouth, and advanced to the second part                            of duodenum. The upper GI endoscopy was                            accomplished without difficulty. The patient                            tolerated the procedure well. Scope In: Scope Out: Findings:      LA Grade A (one or more mucosal breaks less than 5 mm, not extending       between tops of 2 mucosal folds) esophagitis with no bleeding was found       in the distal esophagus.      Localized severe inflammation characterized by congestion (edema),       erosions, erythema and aphthous ulcerations was found in the gastric       fundus, in the gastric body and in the gastric antrum. Biopsies were       taken with a cold forceps for histology.      One non-bleeding cratered duodenal ulcer with a clean ulcer base       (Forrest Class III) was found in the duodenal bulb. The lesion was 12 mm       in largest dimension. Impression:               - LA Grade A esophagitis with no bleeding.                           - Gastritis. Biopsied.                           - Non-bleeding duodenal ulcer with a clean ulcer  base (Forrest Class III). Recommendation:           - Discharge patient to home (with escort).                           - Await pathology results.                           - Use a proton pump inhibitor PO BID for 8 weeks.                           - No ibuprofen, naproxen, or other non-steroidal                            anti-inflammatory drugs.                           - The findings and recommendations were discussed                            with the patient. Procedure Code(s):        --- Professional ---                           [phone], Esophagogastroduodenoscopy, flexible,                             transoral; with biopsy, single or multiple Diagnosis Code(s):        --- Professional ---                           K20.90, Esophagitis, unspecified without bleeding                           K29.70, Gastritis, unspecified, without bleeding                           K26.9, Duodenal ulcer, unspecified as acute or                            chronic, without hemorrhage or perforation                           R10.13, Epigastric pain                           R11.2, Nausea with vomiting, unspecified CPT copyright 2019 American Medical Association. All rights reserved. The codes documented in this report are preliminary and upon coder review may  be revised to meet current compliance requirements. Dr Georgian Co "Lyndee Leo" Lorenso Courier,  12/06/2021 2:57:15 PM Number of Addenda: 0

## 2021-12-06 NOTE — Anesthesia Preprocedure Evaluation (Addendum)
Anesthesia Evaluation  Patient identified by MRN, date of birth, ID band Patient awake    Reviewed: Allergy & Precautions, H&P , NPO status , Patient's Chart, lab work & pertinent test results, reviewed documented beta blocker date and time   Airway Mallampati: II  TM Distance: >3 FB Neck ROM: Full    Dental no notable dental hx. (+) Teeth Intact, Dental Advisory Given   Pulmonary neg pulmonary ROS,    Pulmonary exam normal breath sounds clear to auscultation       Cardiovascular hypertension, Pt. on medications and Pt. on home beta blockers  Rhythm:Regular Rate:Normal     Neuro/Psych Anxiety Depression negative neurological ROS     GI/Hepatic GERD  Medicated,(+)     substance abuse  alcohol use,   Endo/Other  negative endocrine ROS  Renal/GU Renal InsufficiencyRenal disease  negative genitourinary   Musculoskeletal  (+) Arthritis , Osteoarthritis,    Abdominal   Peds  Hematology negative hematology ROS (+)   Anesthesia Other Findings   Reproductive/Obstetrics negative OB ROS                            Anesthesia Physical Anesthesia Plan  ASA: 3  Anesthesia Plan: MAC   Post-op Pain Management: Minimal or no pain anticipated   Induction: Intravenous  PONV Risk Score and Plan: 1 and Propofol infusion  Airway Management Planned: Natural Airway and Nasal Cannula  Additional Equipment:   Intra-op Plan:   Post-operative Plan:   Informed Consent: I have reviewed the patients History and Physical, chart, labs and discussed the procedure including the risks, benefits and alternatives for the proposed anesthesia with the patient or authorized representative who has indicated his/her understanding and acceptance.     Dental advisory given  Plan Discussed with: CRNA  Anesthesia Plan Comments:         Anesthesia Quick Evaluation

## 2021-12-06 NOTE — Consult Note (Signed)
Cardiology Consultation   Patient ID: Joshua Solis MRN: [id number]; DOB: 04-01-1945  Admit date: 12/04/2021 Date of Consult: 12/06/2021  PCP:  [facility] Cardiologist:  Minus Breeding, MD   {   Patient Profile:   Joshua Solis is a 76 y.o. male with a hx of hypertension, CKD stage II, GERD, H. pylori, major depressive disorder, BPH who is being seen 12/06/2021 for the evaluation of elevated troponin at the request of Dr. Reesa Chew.  History of Present Illness:   Joshua Solis presented to [facility] on Monday 10/2 with symptoms of epigastric abdominal pain.  Patient reported eating a [employer] burger last Wednesday 9/27 with rapid onset of abdominal pain, nausea and vomiting, diarrhea.  The symptoms reportedly waxed and waned for the rest of the week last week, seemingly worsened with food consumption.  With symptoms persisting and feeling worse on Monday, patient presented to med Center.  He describes pain in the epigastric region of his abdomen radiating around to his back.  Pain reportedly sharp in nature and not relieved with Tylenol.  While in [facility], CT abdomen/pelvis was without acute pathology.  Labs were notable for LFTs and lipase within normal limits.  Creatinine was at patient's baseline of 1.2.  Right upper quadrant ultrasound with a small liver cyst and no other pathology.  A troponin was checked and found mildly elevated at 35.  Patient with history of H. pylori gastritis back in 2020 which was treated with proton pump inhibitor.  Since admission to medicine service, patient has been treated with twice daily proton pump inhibitor with Carafate 4 times daily.  On my exam today patient reports a significant improvement in abdominal discomfort compared with day of admission.  He denies any radiation of this abdominal pain into his chest.  He also denies recent chest pain, shortness of breath, near syncope.   Regarding  cardiac history, appears that patient had symptoms concerning enough to [facility] but this was not done.  Reportedly patient had a stress perfusion study which demonstrated a small reversible inferior defect but did not undergo heart catheterization due to parking challenges on the day of the procedure.  Since that time, patient has seen Dr. Percival Spanish and [facility], most recently 11/21/2020 and found stable without indication for heart catheterization.  Past Medical History:  Diagnosis Date   Alcohol abuse    02-02-2021  pt stated drinks 2 beers daily (12 oz) and one pint of gin daily   Arthritis    BPH associated with nocturia    CKD (chronic kidney disease), stage II    a. Cr 1.3 during 09/2015 adm, chronicity unclear at that time; f/u value 1.18.   Diverticulosis of colon    ED (erectile dysfunction)    Fatty liver    GERD (gastroesophageal reflux disease)    History of gastric ulcer    remote hx   Hyperlipidemia    Hypertensive heart disease without heart failure    cardiologist-- dr hochrein;   ETT 08-18-2015 in epic, no ischemia and hx [facility] small reversible inferior defect;  echo 12-11-2020 in epic ef 60-65%   MDD (major depressive disorder)    PTSD (post-traumatic stress disorder)    Umbilical hernia     Past Surgical History:  Procedure Laterality Date   INGUINAL HERNIA REPAIR Left 2000   KNEE ARTHROSCOPY  W/ MENISCAL REPAIR Right 08/11/2020   @[facility]    UMBILICAL HERNIA REPAIR N/A 02/07/2021   Procedure: HERNIA REPAIR UMBILICAL ADULT WITH MESH;  Surgeon: Stechschulte, Nickola Major, MD;  Location: [facility];  Service: General;  Laterality: N/A;   VITRECTOMY Bilateral 2017   approx     Home Medications:  Prior to Admission medications   Medication Sig Start Date End Date Taking? Authorizing Provider  amLODipine (NORVASC) 10 MG tablet Take 1 tablet (10 mg total) by mouth  daily. Patient taking differently: Take 10 mg by mouth daily. 10/07/19  Yes Minus Breeding, MD  aspirin (ASPIRIN LOW DOSE) 81 MG EC tablet TAKE 1 TABLET(81 MG) BY MOUTH DAILY Patient taking differently: 81 mg daily. TAKE 1 TABLET(81 MG) BY MOUTH DAILY 12/22/20  Yes Minus Breeding, MD  atorvastatin (LIPITOR) 80 MG tablet Take 1 tablet (80 mg total) by mouth at bedtime. 12/22/20  Yes Minus Breeding, MD  calcium carbonate (TUMS - DOSED IN MG ELEMENTAL CALCIUM) 500 MG chewable tablet Chew 1 tablet by mouth daily.   Yes [provider]  cetirizine (ZYRTEC) 10 MG tablet Take 10 mg by mouth daily.   Yes [provider]  metoprolol tartrate (LOPRESSOR) 25 MG tablet Take 25 mg by mouth daily.   Yes [provider]  Multiple Vitamins-Minerals (MENS ONE DAILY PO) Take 1 tablet by mouth daily.   Yes [provider]  nitroGLYCERIN (NITROSTAT) 0.4 MG SL tablet Place 0.4 mg under the tongue every 5 (five) minutes as needed for chest pain (x 3 doses).   Yes [provider]  omeprazole (PRILOSEC) 20 MG capsule Take 1 capsule (20 mg total) by mouth daily. Patient taking differently: Take 20 mg by mouth daily as needed. 08/30/19  Yes Tegeler, Gwenyth Allegra, MD  sertraline (ZOLOFT) 100 MG tablet Take 100 mg by mouth at bedtime.   Yes [provider]  sildenafil (VIAGRA) 100 MG tablet Take 100 mg by mouth daily as needed for erectile dysfunction.   Yes [provider]  tamsulosin (FLOMAX) 0.4 MG CAPS capsule Take 0.4 mg by mouth at bedtime.   Yes [provider]  terazosin (HYTRIN) 10 MG capsule Take 10 mg by mouth at bedtime.   Yes [provider]  traZODone (DESYREL) 50 MG tablet Take 50 mg by mouth at bedtime.   Yes [provider]  ibuprofen (ADVIL,MOTRIN) 200 MG tablet Take 400 mg by mouth every 6 (six) hours as needed for moderate pain. Patient not taking: Reported on 12/05/2021    [provider]    Inpatient  Medications: Scheduled Meds:  amLODipine  10 mg Oral Daily   aspirin EC  81 mg Oral Daily   atorvastatin  80 mg Oral QHS   metoprolol tartrate  25 mg Oral BID   pantoprazole (PROTONIX) IV  40 mg Intravenous Q12H   sertraline  100 mg Oral QHS   tamsulosin  0.4 mg Oral QHS   terazosin  10 mg Oral QHS   traZODone  50 mg Oral QHS   Continuous Infusions:  sodium chloride 75 mL/hr at 12/06/21 0716   sodium chloride     potassium chloride 10 mEq (12/06/21 1250)   PRN Meds: acetaminophen, guaiFENesin, hydrALAZINE, ipratropium-albuterol, metoprolol tartrate, morphine injection, ondansetron **OR** ondansetron (ZOFRAN) IV, oxyCODONE-acetaminophen, senna-docusate  Allergies:   No Known Allergies  Social History:   Social History   Socioeconomic History   Marital status: Married    Spouse name: Not on file   Number  of children: 2   Years of education: Not on file   Highest education level: Not on file  Occupational History   Occupation: Makes furniture  Tobacco Use   Smoking status: Never   Smokeless tobacco: Never  Substance and Sexual Activity   Alcohol use: Yes    Alcohol/week: 84.0 standard drinks of alcohol    Types: 14 Cans of beer, 70 Shots of liquor per week    Comment: 02-02-2021  2 beers daily (12 oz) and pint of gin daily   Drug use: No   Sexual activity: Not on file  Other Topics Concern   Not on file  Social History Narrative   Lives at home with wife.     Social Determinants of Health   Financial Resource Strain: Not on file  Food Insecurity: No Food Insecurity (12/05/2021)   Hunger Vital Sign    Worried About Running Out of Food in the Last Year: Never true    Ran Out of Food in the Last Year: Never true  Transportation Needs: No Transportation Needs (12/05/2021)   PRAPARE - Hydrologist (Medical): No    Lack of Transportation (Non-Medical): No  Physical Activity: Not on file  Stress: Not on file  Social Connections: Not on file   Intimate Partner Violence: Not At Risk (12/05/2021)   Humiliation, Afraid, Rape, and Kick questionnaire    Fear of Current or Ex-Partner: No    Emotionally Abused: No    Physically Abused: No    Sexually Abused: No    Family History:    Family History  Problem Relation Age of Onset   Stroke Mother 44   Pneumonia Mother      ROS:  Please see the history of present illness.   All other ROS reviewed and negative.     Physical Exam/Data:   Vitals:   12/06/21 0003 12/06/21 0412 12/06/21 0826 12/06/21 0828  BP: (!) 142/71 134/67 (!) 158/72 (!) 158/72  Pulse: (!) 56 61 89 98  Resp: 16 16 18    Temp: 98.5 F (36.9 C) 97.7 F (36.5 C) 98.3 F (36.8 C)   TempSrc: Oral Oral Oral   SpO2: 98% 97%    Weight:      Height:        Intake/Output Summary (Last 24 hours) at 12/06/2021 1251 Last data filed at 12/06/2021 0900 Gross per 24 hour  Intake 810.79 ml  Output 500 ml  Net 310.79 ml      12/04/2021    4:43 PM 02/07/2021    7:15 AM 02/02/2021    3:41 PM  Last 3 Weights  Weight (lbs) 185 lb 188 lb 3.2 oz 185 lb  Weight (kg) 83.915 kg 85.367 kg 83.915 kg     Body mass index is 28.13 kg/m.  General:  Well nourished, well developed, in no acute distress HEENT: normal Neck: no JVD Vascular: No carotid bruits; Distal pulses 2+ bilaterally Cardiac:  normal S1, S2; RRR; no murmur  Lungs:  clear to auscultation bilaterally, no wheezing, rhonchi or rales  Abd: Upper abdominal tenderness to palpation.  Normal bowel sounds. Ext: no edema Musculoskeletal:  No deformities, BUE and BLE strength normal and equal Skin: warm and dry  Neuro:  CNs 2-12 intact, no focal abnormalities noted Psych:  Normal affect   EKG: No EKG since 10/3.  Repeat ordered. Telemetry:  Telemetry was personally reviewed and demonstrates:  sinus rhythm  Relevant CV Studies:  12/11/2020 TTE  IMPRESSIONS     1. Left ventricular ejection fraction, by estimation, is 60 to 65%. The  left ventricle has  normal function. The left ventricle has no regional  wall motion abnormalities. Left ventricular diastolic parameters were  normal.   2. Right ventricular systolic function is normal. The right ventricular  size is normal. There is normal pulmonary artery systolic pressure.   3. The mitral valve is grossly normal. Trivial mitral valve  regurgitation.   4. The aortic valve is tricuspid. Aortic valve regurgitation is not  visualized. No aortic stenosis is present.   FINDINGS   Left Ventricle: Left ventricular ejection fraction, by estimation, is 60  to 65%. The left ventricle has normal function. The left ventricle has no  regional wall motion abnormalities. The left ventricular internal cavity  size was normal in size. There is   no left ventricular hypertrophy. Left ventricular diastolic parameters  were normal.   Right Ventricle: The right ventricular size is normal. Right vetricular  wall thickness was not well visualized. Right ventricular systolic  function is normal. There is normal pulmonary artery systolic pressure.  The tricuspid regurgitant velocity is 2.05  m/s, and with an assumed right atrial pressure of 3 mmHg, the estimated  right ventricular systolic pressure is 84.1 mmHg.   Left Atrium: Left atrial size was normal in size.   Right Atrium: Right atrial size was normal in size.   Pericardium: Trivial pericardial effusion is present.   Mitral Valve: The mitral valve is grossly normal. Trivial mitral valve  regurgitation.   Tricuspid Valve: The tricuspid valve is grossly normal. Tricuspid valve  regurgitation is mild.   Aortic Valve: The aortic valve is tricuspid. Aortic valve regurgitation is  not visualized. No aortic stenosis is present.   Pulmonic Valve: The pulmonic valve was grossly normal. Pulmonic valve  regurgitation is not visualized.   Aorta: The aortic root and ascending aorta are structurally normal, with  no evidence of dilitation.   IAS/Shunts:  The atrial septum is grossly normal.   Laboratory Data:  High Sensitivity Troponin:   Recent Labs  Lab 12/04/21 1648 12/04/21 1851 12/05/21 1814 12/05/21 1953  TROPONINIHS 35* 35* 35* 35*     Chemistry Recent Labs  Lab 12/04/21 1648 12/05/21 1814 12/06/21 0207  NA 133*  --  132*  K 3.5  --  3.4*  CL 104  --  103  CO2 21*  --  22  GLUCOSE 135*  --  120*  BUN 11  --  7*  CREATININE 1.25* 1.12 1.10  CALCIUM 8.8*  --  8.7*  MG  --   --  2.1  GFRNONAA >60 >60 >60  ANIONGAP 8  --  7    Recent Labs  Lab 12/04/21 1648  PROT 7.6  ALBUMIN 4.1  AST 18  ALT 22  ALKPHOS 55  BILITOT 1.1   Lipids  Recent Labs  Lab 12/05/21 1814  CHOL 213*  TRIG 101  HDL 42  LDLCALC 151*  CHOLHDL 5.1    Hematology Recent Labs  Lab 12/04/21 1648 12/05/21 1814 12/06/21 0207  WBC 5.9 6.2 5.1  RBC 4.23 4.57 4.06*  HGB 13.6 14.5 12.9*  HCT 38.3* 41.9 36.9*  MCV 90.5 91.7 90.9  MCH 32.2 31.7 31.8  MCHC 35.5 34.6 35.0  RDW 12.5 12.5 12.4  PLT 261 289 263   Thyroid No results for input(s): "TSH", "FREET4" in the last 168 hours.  BNPNo results for input(s): "BNP", "PROBNP" in the last  168 hours.  DDimer No results for input(s): "DDIMER" in the last 168 hours.   Radiology/Studies:  US Abdomen Limited RUQ (LIVER/GB)  Result Date: 12/04/2021 CLINICAL DATA:  Right upper quadrant pain. EXAM: ULTRASOUND ABDOMEN LIMITED RIGHT UPPER QUADRANT COMPARISON:  None Available. FINDINGS: Gallbladder: No gallstones or wall thickening visualized (1.5 mm). No sonographic Murphy sign noted by sonographer. Common bile duct: Diameter: 2.6 mm Liver: A 1.6 cm cyst is seen within the left lobe of the liver. Diffusely increased echogenicity of the liver parenchyma is noted. Portal vein is patent on color Doppler imaging with normal direction of blood flow towards the liver. Other: A small left pleural effusion is noted. IMPRESSION: 1. Hepatic steatosis with a 1.6 cm simple hepatic cyst. 2. Small left pleural  effusion. Electronically Signed   By: Virgina Norfolk M.D.   On: 12/04/2021 21:49   DG Chest 2 View  Result Date: 12/04/2021 CLINICAL DATA:  Chest and epigastric pain for several days following eating spicy food, initial encounter EXAM: CHEST - 2 VIEW COMPARISON:  08/30/2019 FINDINGS: Cardiac shadow is stable. Lungs are clear bilaterally. No bony abnormality is noted. IMPRESSION: No active cardiopulmonary disease. Electronically Signed   By: Inez Catalina M.D.   On: 12/04/2021 19:38   CT ABDOMEN PELVIS W CONTRAST  Result Date: 12/04/2021 CLINICAL DATA:  Epigastric pain. EXAM: CT ABDOMEN AND PELVIS WITH CONTRAST TECHNIQUE: Multidetector CT imaging of the abdomen and pelvis was performed using the standard protocol following bolus administration of intravenous contrast. RADIATION DOSE REDUCTION: This exam was performed according to the departmental dose-optimization program which includes automated exposure control, adjustment of the mA and/or kV according to patient size and/or use of iterative reconstruction technique. CONTRAST:  163mL OMNIPAQUE IOHEXOL 300 MG/ML  SOLN COMPARISON:  CT abdomen and pelvis 06/29/2020 FINDINGS: Lower chest: No acute abnormality. Hepatobiliary: There are rounded hypodensities in the liver which are too small to characterize, likely cysts. These measure up to 1 cm and appear unchanged from prior. No new liver lesions are seen. Gallbladder and bile ducts are within normal limits. Pancreas: Unremarkable. No pancreatic ductal dilatation or surrounding inflammatory changes. Spleen: Normal in size without focal abnormality. Adrenals/Urinary Tract: Rounded cortical hypodensities in both kidneys are too small to characterize, likely cysts. Otherwise, the kidneys, adrenal glands and bladder are within normal limits. Stomach/Bowel: Stomach is within normal limits. Appendix appears normal. No evidence of bowel wall thickening, distention, or inflammatory changes. There is diffuse colonic  diverticulosis. Vascular/Lymphatic: Aortic atherosclerosis. No enlarged abdominal or pelvic lymph nodes. Reproductive: Prostate gland is enlarged measuring 6.4 by 4.8 by 4.9 cm. Other: There has been interval repair of umbilical hernia. No recurrent hernia. No ascites. Musculoskeletal: There are degenerative changes of the lower lumbar spine. IMPRESSION: 1. No acute localizing process in the abdomen or pelvis. 2. Colonic diverticulosis without evidence for diverticulitis. 3. Interval repair of umbilical hernia. No recurrent hernia. 4. Prostatomegaly. 5. Subcentimeter Bosniak II renal cyst, too small to characterize. No follow-up imaging is recommended. JACR 2018 Feb; 264-273, Management of the Incidental RenalMass on CT, [employer] 2021; 814-848, Bosniak Classification of Cystic Renal Masses, Version 2019. Aortic Atherosclerosis (ICD10-I70.0). Electronically Signed   By: Ronney Asters M.D.   On: 12/04/2021 19:37     Assessment and Plan:   Epigastric pain Elevated troponin  Patient with gastroenterological symptoms for the past week including epigastric pain with nausea, vomiting, diarrhea.  Found with mild but stable elevation of troponin at 35 on multiple labs this admission.  10/3 ECG  with apparently new inferior lead T wave inversions.  Patient with reproducible epigastric pain on exam today.  Chart review shows that patient reportedly had a 2018 stress perfusion study which demonstrated a small reversible inferior defect but did not undergo heart catheterization due to parking challenges on the day of the procedure.  Given epigastric pain reproducible on exam and only mildly elevated troponin that remained stable at 35, feel that patient's pain is most likely GI in origin rather than cardiac.  Will recheck an EKG. we will also plan to assess left ventricle for regional wall motion abnormalities on an echo today.  Assuming that no acute abnormalities noted, patient unlikely to need further ischemic  evaluation while inpatient.  Can be seen in outpatient setting with consideration of cardiac CTA and/or stress test. Continue with GI prophylaxis including PPI per primary team/gastroenterology  Essential hypertension  Patient currently on amlodipine 10 mg, metoprolol tartrate 25 mg twice daily.  Given ongoing hypertension, will switch to Coreg 6.25 twice daily from metoprolol.  HLD  Continue atorvastatin 80 mg   Risk Assessment/Risk Scores:                For questions or updates, please contact [place] Please consult [email or web address] for contact info under    Signed, Lily Kocher, PA-C  12/06/2021 12:51 PM

## 2021-12-06 NOTE — Interval H&P Note (Signed)
History and Physical Interval Note:  12/06/2021 1:38 PM  Joshua Solis  has presented today for surgery, with the diagnosis of non cardiac chest pain, history of H pylori gastritis 2018.  The various methods of treatment have been discussed with the patient and family. After consideration of risks, benefits and other options for treatment, the patient has consented to  Procedure(s): ESOPHAGOGASTRODUODENOSCOPY (EGD) WITH PROPOFOL (N/A) as a surgical intervention.  The patient's history has been reviewed, patient examined, no change in status, stable for surgery.  I have reviewed the patient's chart and labs.  Questions were answered to the patient's satisfaction.     Sharyn Creamer

## 2021-12-06 NOTE — Progress Notes (Signed)
Mobility Specialist - Progress Note   12/06/21 0918  Mobility  Activity Ambulated with assistance in hallway  Activity Response Tolerated well  Distance Ambulated (ft) 470 ft  $Mobility charge 1 Mobility  Level of Assistance Standby assist, set-up cues, supervision of patient - no hands on  Assistive Device None  Mobility Referral Yes    Pre-mobility: 72 HR During mobility:74  HR Post-mobility:64 HR  Pt was received in bed and agreeable to mobility. No complaints throughout. Pt was returned to bed with all needs met.   Larey Seat

## 2021-12-06 NOTE — Progress Notes (Signed)
EKG obtained. Cardiology team paged about results.

## 2021-12-06 NOTE — Anesthesia Procedure Notes (Signed)
Procedure Name: MAC Date/Time: 12/06/2021 2:18 PM  Performed by: Moshe Salisbury, CRNAPre-anesthesia Checklist: Patient identified, Emergency Drugs available, Suction available and Patient being monitored Oxygen Delivery Method: Nasal cannula Placement Confirmation: positive ETCO2 Dental Injury: Teeth and Oropharynx as per pre-operative assessment

## 2021-12-06 NOTE — Care Management (Addendum)
  Transition of Care [facility]) Screening Note   Patient Details  Name: Joshua Solis Date of Birth: 1945-12-22   Transition of Care [facility]) CM/SW Contact:    Bethena Roys, RN Phone Number: 12/06/2021, 11:46 AM    Transition of Care Department [facility]) has reviewed the patient and no TOC needs have been identified at this time. Case Manager did call the [place] Coordinator to make her aware of the hospitalization. Awaiting call back for [facility] details. Case Manager will continue to monitor patient advancement through interdisciplinary progression rounds. If new patient transition needs arise, please place a TOC consult.  [place] 12-06-21 [place] states that the patient is a member of the [employer]. PCP is Theotis Burrow and CSW is Dawn Detgen at [phone]- ext [phone].
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
## 2021-12-06 NOTE — Transfer of Care (Signed)
Immediate Anesthesia Transfer of Care Note  Patient: Joshua Solis  Procedure(s) Performed: ESOPHAGOGASTRODUODENOSCOPY (EGD) WITH PROPOFOL BIOPSY  Patient Location: PACU  Anesthesia Type:MAC  Level of Consciousness: drowsy and patient cooperative  Airway & Oxygen Therapy: Patient Spontanous Breathing and Patient connected to nasal cannula oxygen  Post-op Assessment: Report given to RN and Post -op Vital signs reviewed and stable  Post vital signs: Reviewed and stable  Last Vitals:  Vitals Value Taken Time  BP 100/50 12/06/21 1437  Temp 36.2 C 12/06/21 1437  Pulse 66 12/06/21 1438  Resp 16 12/06/21 1438  SpO2 98 % 12/06/21 1438  Vitals shown include unvalidated device data.  Last Pain:  Vitals:   12/06/21 1437  TempSrc: Tympanic  PainSc: Asleep      Patients Stated Pain Goal: 0 ([id number])  Complications: No notable events documented.

## 2021-12-07 ENCOUNTER — Other Ambulatory Visit: Payer: Self-pay | Admitting: Student

## 2021-12-07 ENCOUNTER — Other Ambulatory Visit (HOSPITAL_COMMUNITY): Payer: Self-pay

## 2021-12-07 ENCOUNTER — Other Ambulatory Visit: Payer: Self-pay

## 2021-12-07 DIAGNOSIS — R079 Chest pain, unspecified: Secondary | ICD-10-CM | POA: Diagnosis not present

## 2021-12-07 DIAGNOSIS — R7989 Other specified abnormal findings of blood chemistry: Secondary | ICD-10-CM | POA: Diagnosis not present

## 2021-12-07 DIAGNOSIS — R9431 Abnormal electrocardiogram [ECG] [EKG]: Secondary | ICD-10-CM

## 2021-12-07 DIAGNOSIS — E7800 Pure hypercholesterolemia, unspecified: Secondary | ICD-10-CM | POA: Diagnosis not present

## 2021-12-07 DIAGNOSIS — I10 Essential (primary) hypertension: Secondary | ICD-10-CM | POA: Diagnosis not present

## 2021-12-07 DIAGNOSIS — R1013 Epigastric pain: Secondary | ICD-10-CM | POA: Diagnosis not present

## 2021-12-07 LAB — BASIC METABOLIC PANEL
Anion gap: 6 (ref 5–15)
BUN: 10 mg/dL (ref 8–23)
CO2: 22 mmol/L (ref 22–32)
Calcium: 8.8 mg/dL — ABNORMAL LOW (ref 8.9–10.3)
Chloride: 107 mmol/L (ref 98–111)
Creatinine, Ser: 1.14 mg/dL (ref 0.61–1.24)
GFR, Estimated: >60 mL/min (ref >60)
Glucose, Bld: 90 mg/dL (ref 70–99)
Potassium: 4.1 mmol/L (ref 3.5–5.1)
Sodium: 135 mmol/L (ref 135–145)

## 2021-12-07 LAB — CBC
HCT: 35.1 % — ABNORMAL LOW (ref 39.0–52.0)
Hemoglobin: 12.2 g/dL — ABNORMAL LOW (ref 13.0–17.0)
MCH: 32.1 pg (ref 26.0–34.0)
MCHC: 34.8 g/dL (ref 30.0–36.0)
MCV: 92.4 fL (ref 80.0–100.0)
Platelets: 237 10*3/uL (ref 150–400)
RBC: 3.8 MIL/uL — ABNORMAL LOW (ref 4.22–5.81)
RDW: 12.4 % (ref 11.5–15.5)
WBC: 5.3 10*3/uL (ref 4.0–10.5)
nRBC: 0 % (ref 0.0–0.2)

## 2021-12-07 LAB — ECHOCARDIOGRAM COMPLETE
AR max vel: 2.2 cm2
AV Area VTI: 1.86 cm2
AV Area mean vel: 1.83 cm2
AV Mean grad: 5 mmHg
AV Peak grad: 9 mmHg
Ao pk vel: 1.5 m/s
Area-P 1/2: 2.27 cm2
Height: 68 in
S' Lateral: 3.1 cm
Weight: 2912 oz

## 2021-12-07 LAB — MAGNESIUM: Magnesium: 1.9 mg/dL (ref 1.7–2.4)

## 2021-12-07 MED ORDER — ATORVASTATIN CALCIUM 80 MG PO TABS
80.0000 mg | ORAL_TABLET | Freq: Every day | ORAL | 0 refills | Status: AC
  Filled 2021-12-07: qty 90, 90d supply, fill #0

## 2021-12-07 MED ORDER — PANTOPRAZOLE SODIUM 40 MG PO TBEC
40.0000 mg | DELAYED_RELEASE_TABLET | Freq: Two times a day (BID) | ORAL | 0 refills | Status: AC
  Filled 2021-12-07: qty 60, 30d supply, fill #0

## 2021-12-07 MED ORDER — CARVEDILOL 6.25 MG PO TABS
6.2500 mg | ORAL_TABLET | Freq: Two times a day (BID) | ORAL | 0 refills | Status: DC
  Filled 2021-12-07: qty 60, 30d supply, fill #0

## 2021-12-07 NOTE — Progress Notes (Signed)
Mobility Specialist - Progress Note   12/07/21 1101  Mobility  Activity Ambulated with assistance in hallway  Activity Response Tolerated well  Distance Ambulated (ft) 470 ft  $Mobility charge 1 Mobility  Level of Assistance Standby assist, set-up cues, supervision of patient - no hands on  Assistive Device None  Mobility Referral Yes   Pt was received in bed and agreeable to mobility. No complaints throughout ambulation. Pt was returned to bed with all needs met.  Larey Seat

## 2021-12-07 NOTE — Discharge Summary (Signed)
Physician Discharge Summary  Tristyn Harne [id number] DOB: 07/13/1945 DOA: 12/04/2021  PCP: [facility]  Admit date: 12/04/2021 Discharge date: 12/07/2021  Admitted From: Home Disposition: Home  Recommendations for Outpatient Follow-up:  Follow up with PCP in 1-2 weeks Please obtain BMP/CBC in one week your next doctors visit.  PPI twice daily before meals at least for 8 weeks.  GI to follow-up outpatient with biopsy results. Advised to avoid NSAIDs Metoprolol changed to Coreg by cardiology.  Outpatient follow-up by their service. Advise low-fat diet Repeat right upper quadrant ultrasound in 3 to 6 months to reevaluate liver cyst   Discharge Condition: Stable CODE STATUS: Full code Diet recommendation: Heart healthy  Brief/Interim Summary: 76 y.o. male with medical history significant of HTN, CKD stage II, GERD, history of H. pylori, MDD, BPH comes to the hospital at [facility] with complaints of epigastric abdominal pain.  LFTs and lipase were normal, previous history of H. pylori gastritis therefore GI consulted for endoscopy.  Mildly elevated troponin therefore cardiology following as well. Echocardiogram overall was unremarkable.  He is cleared for discharge     Assessment & Plan:  Principal Problem:   Chest pain, rule out acute myocardial infarction Active Problems:   Essential hypertension   Hyperlipidemia   LVH (left ventricular hypertrophy)     Epigastric abdominal pain - Currently lipase and LFTs are unremarkable.  Right upper quadrant ultrasound shows benign small liver cyst.  CT abdomen pelvis negative for acute pathology.  Placed him on PPI twice daily, Carafate QID.  Does have history of H. pylori gastritis couple years ago upon endoscopy at [facility].  Patient had endoscopy performed by GI on 10/5 which showed gastritis, esophagitis and nonbleeding duodenal ulcer.  Biopsies were taken which will be followed up outpatient by gastroenterology.   Patient was also seen by cardiology and echocardiogram was performed which was unremarkable.   Elevated troponin - Troponins remain flat at this time.  Echocardiogram in October 2022 was unremarkable.  Cardiology consulted.  Cardiology cleared for discharge   Essential hypertension - On Norvasc, Lopressor changed to Coreg upon discharge by cardiology   BPH - Flomax   Hyperlipidemia - LD L151, already on high-dose statin.  Advised low-fat diet   Alcohol use - Used to drink heavily but now only couple of beers a week.  Not showing any signs of withdrawal, will continue to monitor   CKD stage II - Baseline creatinine 1.2.  Monitor       Discharge Diagnoses:  Principal Problem:   Chest pain, rule out acute myocardial infarction Active Problems:   Essential hypertension   Hyperlipidemia   LVH (left ventricular hypertrophy)   Elevated troponin   Epigastric abdominal pain      Consultations: Cardiology Gastroenterology  Subjective: Feeling well this morning no complaints.  Ready to go home  Discharge Exam: Vitals:   12/07/21 0812 12/07/21 0847  BP: (!) 156/72 (!) 156/72  Pulse: (!) 56 73  Resp: 16   Temp: 97.8 F (36.6 C)   SpO2: 99%    Vitals:   12/06/21 1949 12/07/21 0623 12/07/21 0812 12/07/21 0847  BP: (!) 157/67 139/87 (!) 156/72 (!) 156/72  Pulse: 62 (!) 43 (!) 56 73  Resp:   16   Temp: 97.6 F (36.4 C) 97.8 F (36.6 C) 97.8 F (36.6 C)   TempSrc: Oral Oral Oral   SpO2: 99% 100% 99%   Weight:      Height:  General: Pt is alert, awake, not in acute distress Cardiovascular: RRR, S1/S2 +, no rubs, no gallops Respiratory: CTA bilaterally, no wheezing, no rhonchi Abdominal: Soft, NT, ND, bowel sounds + Extremities: no edema, no cyanosis  Discharge Instructions   Allergies as of 12/07/2021   No Known Allergies      Medication List     STOP taking these medications    ibuprofen 200 MG tablet Commonly known as: ADVIL   metoprolol  tartrate 25 MG tablet Commonly known as: LOPRESSOR   omeprazole 20 MG capsule Commonly known as: PRILOSEC       TAKE these medications    amLODipine 10 MG tablet Commonly known as: NORVASC Take 1 tablet (10 mg total) by mouth daily.   aspirin EC 81 MG tablet Commonly known as: Aspirin Low Dose TAKE 1 TABLET(81 MG) BY MOUTH DAILY What changed:  how much to take when to take this   atorvastatin 80 MG tablet Commonly known as: LIPITOR Take 1 tablet (80 mg total) by mouth at bedtime.   calcium carbonate 500 MG chewable tablet Commonly known as: TUMS - dosed in mg elemental calcium Chew 1 tablet by mouth daily.   carvedilol 6.25 MG tablet Commonly known as: COREG Take 1 tablet (6.25 mg total) by mouth 2 (two) times daily with a meal.   cetirizine 10 MG tablet Commonly known as: ZYRTEC Take 10 mg by mouth daily.   MENS ONE DAILY PO Take 1 tablet by mouth daily.   nitroGLYCERIN 0.4 MG SL tablet Commonly known as: NITROSTAT Place 0.4 mg under the tongue every 5 (five) minutes as needed for chest pain (x 3 doses).   pantoprazole 40 MG tablet Commonly known as: PROTONIX Take 1 tablet (40 mg total) by mouth 2 (two) times daily before a meal.   sertraline 100 MG tablet Commonly known as: ZOLOFT Take 100 mg by mouth at bedtime.   sildenafil 100 MG tablet Commonly known as: VIAGRA Take 100 mg by mouth daily as needed for erectile dysfunction.   tamsulosin 0.4 MG Caps capsule Commonly known as: FLOMAX Take 0.4 mg by mouth at bedtime.   terazosin 10 MG capsule Commonly known as: HYTRIN Take 10 mg by mouth at bedtime.   traZODone 50 MG tablet Commonly known as: DESYREL Take 50 mg by mouth at bedtime.        Follow-up Information     Minus Breeding, MD Follow up.   Specialty: Cardiology Why: Hospital follow-up with Cardiology scheduled for 01/08/2022 at 3:40pm. Please arrive 15 minutes early for check-in. If this date/time does not work for you, please call  our office to rescheule. Our office will also call you to arrange outpatient coronary calcium score. Contact information: [address] [place] [address] [phone]                No Known Allergies  You were cared for by a hospitalist during your hospital stay. If you have any questions about your discharge medications or the care you received while you were in the hospital after you are discharged, you can call the unit and asked to speak with the hospitalist on call if the hospitalist that took care of you is not available. Once you are discharged, your primary care physician will handle any further medical issues. Please note that no refills for any discharge medications will be authorized once you are discharged, as it is imperative that you return to your primary care physician (or establish a  relationship with a primary care physician if you do not have one) for your aftercare needs so that they can reassess your need for medications and monitor your lab values.   Procedures/Studies: ECHOCARDIOGRAM COMPLETE  Result Date: 12/07/2021    ECHOCARDIOGRAM REPORT   Patient Name:   Leonette MostCHARLES Southwood Date of Exam: 12/06/2021 Medical Rec #:  [id number]    Height:       68.0 in Accession #:    [id number]   Weight:       182.0 lb Date of Birth:  15-Oct-1945    BSA:          1.963 m Patient Age:    76 years     BP:           152/70 mmHg Patient Gender: M            HR:           56 bpm. Exam Location:  Inpatient Procedure: 2D Echo, Cardiac Doppler and Color Doppler Indications:    Chest pain  History:        Patient has prior history of Echocardiogram examinations, most                 recent 12/11/2020. Risk Factors:Hypertension and Dyslipidemia.                 CKD.  Sonographer:    Ross LudwigArthur Guy RDCS (AE) Referring Phys: [id number] Perlie GoldEVAN WILLIAMS IMPRESSIONS  1. Left ventricular ejection fraction, by estimation, is 60 to 65%. The left ventricle has normal function. The left ventricle has no  regional wall motion abnormalities. There is moderate left ventricular hypertrophy. Left ventricular diastolic parameters were grossly normal.  2. Right ventricular systolic function is normal. The right ventricular size is normal. Tricuspid regurgitation signal is inadequate for assessing PA pressure.  3. The mitral valve is grossly normal. Trivial mitral valve regurgitation. No evidence of mitral stenosis.  4. The aortic valve is tricuspid. There is moderate calcification of the aortic valve. Aortic valve regurgitation is not visualized. Aortic valve sclerosis/calcification is present, without any evidence of aortic stenosis.  5. The inferior vena cava is normal in size with greater than 50% respiratory variability, suggesting right atrial pressure of 3 mmHg. FINDINGS  Left Ventricle: Left ventricular ejection fraction, by estimation, is 60 to 65%. The left ventricle has normal function. The left ventricle has no regional wall motion abnormalities. The left ventricular internal cavity size was normal in size. There is  moderate left ventricular hypertrophy. Left ventricular diastolic parameters were normal. Right Ventricle: The right ventricular size is normal. No increase in right ventricular wall thickness. Right ventricular systolic function is normal. Tricuspid regurgitation signal is inadequate for assessing PA pressure. Left Atrium: Left atrial size was normal in size. Right Atrium: Right atrial size was normal in size. Pericardium: Trivial pericardial effusion is present. Mitral Valve: The mitral valve is grossly normal. Trivial mitral valve regurgitation. No evidence of mitral valve stenosis. Tricuspid Valve: The tricuspid valve is normal in structure. Tricuspid valve regurgitation is trivial. No evidence of tricuspid stenosis. Aortic Valve: The aortic valve is tricuspid. There is moderate calcification of the aortic valve. Aortic valve regurgitation is not visualized. Aortic valve sclerosis/calcification  is present, without any evidence of aortic stenosis. Aortic valve mean gradient measures 5.0 mmHg. Aortic valve peak gradient measures 9.0 mmHg. Aortic valve area, by VTI measures 1.86 cm. Pulmonic Valve: The pulmonic valve was normal in structure. Pulmonic valve regurgitation is trivial. No evidence  of pulmonic stenosis. Aorta: The aortic root is normal in size and structure. Venous: The inferior vena cava is normal in size with greater than 50% respiratory variability, suggesting right atrial pressure of 3 mmHg. IAS/Shunts: No atrial level shunt detected by color flow Doppler.  LEFT VENTRICLE PLAX 2D LVIDd:         4.90 cm   Diastology LVIDs:         3.10 cm   LV e' medial:    6.31 cm/s LV PW:         1.40 cm   LV E/e' medial:  10.4 LV IVS:        1.30 cm   LV e' lateral:   7.83 cm/s LVOT diam:     2.00 cm   LV E/e' lateral: 8.4 LV SV:         70 LV SV Index:   36 LVOT Area:     3.14 cm  RIGHT VENTRICLE RV S prime:     13.50 cm/s TAPSE (M-mode): 2.3 cm LEFT ATRIUM             Index        RIGHT ATRIUM           Index LA diam:        2.30 cm 1.17 cm/m   RA Area:     17.00 cm LA Vol (A2C):   59.8 ml 30.46 ml/m  RA Volume:   43.40 ml  22.10 ml/m LA Vol (A4C):   44.4 ml 22.61 ml/m LA Biplane Vol: 55.2 ml 28.11 ml/m  AORTIC VALVE AV Area (Vmax):    2.20 cm AV Area (Vmean):   1.83 cm AV Area (VTI):     1.86 cm AV Vmax:           150.00 cm/s AV Vmean:          109.000 cm/s AV VTI:            0.379 m AV Peak Grad:      9.0 mmHg AV Mean Grad:      5.0 mmHg LVOT Vmax:         105.00 cm/s LVOT Vmean:        63.400 cm/s LVOT VTI:          0.224 m LVOT/AV VTI ratio: 0.59  AORTA Ao Root diam: 3.00 cm MITRAL VALVE MV Area (PHT): 2.27 cm    SHUNTS MV Decel Time: 334 msec    Systemic VTI:  0.22 m MV E velocity: 65.50 cm/s  Systemic Diam: 2.00 cm MV A velocity: 62.90 cm/s MV E/A ratio:  1.04 Cherlynn Kaiser MD Electronically signed by Cherlynn Kaiser MD Signature Date/Time: 12/07/2021/8:17:35 AM    Final    US Abdomen  Limited RUQ (LIVER/GB)  Result Date: 12/04/2021 CLINICAL DATA:  Right upper quadrant pain. EXAM: ULTRASOUND ABDOMEN LIMITED RIGHT UPPER QUADRANT COMPARISON:  None Available. FINDINGS: Gallbladder: No gallstones or wall thickening visualized (1.5 mm). No sonographic Murphy sign noted by sonographer. Common bile duct: Diameter: 2.6 mm Liver: A 1.6 cm cyst is seen within the left lobe of the liver. Diffusely increased echogenicity of the liver parenchyma is noted. Portal vein is patent on color Doppler imaging with normal direction of blood flow towards the liver. Other: A small left pleural effusion is noted. IMPRESSION: 1. Hepatic steatosis with a 1.6 cm simple hepatic cyst. 2. Small left pleural effusion. Electronically Signed   By: Virgina Norfolk M.D.   On: 12/04/2021  21:49   DG Chest 2 View  Result Date: 12/04/2021 CLINICAL DATA:  Chest and epigastric pain for several days following eating spicy food, initial encounter EXAM: CHEST - 2 VIEW COMPARISON:  08/30/2019 FINDINGS: Cardiac shadow is stable. Lungs are clear bilaterally. No bony abnormality is noted. IMPRESSION: No active cardiopulmonary disease. Electronically Signed   By: Inez Catalina M.D.   On: 12/04/2021 19:38   CT ABDOMEN PELVIS W CONTRAST  Result Date: 12/04/2021 CLINICAL DATA:  Epigastric pain. EXAM: CT ABDOMEN AND PELVIS WITH CONTRAST TECHNIQUE: Multidetector CT imaging of the abdomen and pelvis was performed using the standard protocol following bolus administration of intravenous contrast. RADIATION DOSE REDUCTION: This exam was performed according to the departmental dose-optimization program which includes automated exposure control, adjustment of the mA and/or kV according to patient size and/or use of iterative reconstruction technique. CONTRAST:  162mL OMNIPAQUE IOHEXOL 300 MG/ML  SOLN COMPARISON:  CT abdomen and pelvis 06/29/2020 FINDINGS: Lower chest: No acute abnormality. Hepatobiliary: There are rounded hypodensities in the  liver which are too small to characterize, likely cysts. These measure up to 1 cm and appear unchanged from prior. No new liver lesions are seen. Gallbladder and bile ducts are within normal limits. Pancreas: Unremarkable. No pancreatic ductal dilatation or surrounding inflammatory changes. Spleen: Normal in size without focal abnormality. Adrenals/Urinary Tract: Rounded cortical hypodensities in both kidneys are too small to characterize, likely cysts. Otherwise, the kidneys, adrenal glands and bladder are within normal limits. Stomach/Bowel: Stomach is within normal limits. Appendix appears normal. No evidence of bowel wall thickening, distention, or inflammatory changes. There is diffuse colonic diverticulosis. Vascular/Lymphatic: Aortic atherosclerosis. No enlarged abdominal or pelvic lymph nodes. Reproductive: Prostate gland is enlarged measuring 6.4 by 4.8 by 4.9 cm. Other: There has been interval repair of umbilical hernia. No recurrent hernia. No ascites. Musculoskeletal: There are degenerative changes of the lower lumbar spine. IMPRESSION: 1. No acute localizing process in the abdomen or pelvis. 2. Colonic diverticulosis without evidence for diverticulitis. 3. Interval repair of umbilical hernia. No recurrent hernia. 4. Prostatomegaly. 5. Subcentimeter Bosniak II renal cyst, too small to characterize. No follow-up imaging is recommended. JACR 2018 Feb; 264-273, Management of the Incidental RenalMass on CT, [employer] 2021; [phone], Bosniak Classification of Cystic Renal Masses, Version 2019. Aortic Atherosclerosis (ICD10-I70.0). Electronically Signed   By: Ronney Asters M.D.   On: 12/04/2021 19:37     The results of significant diagnostics from this hospitalization (including imaging, microbiology, ancillary and laboratory) are listed below for reference.     Microbiology: No results found for this or any previous visit (from the past 240 hour(s)).   Labs: BNP (last 3 results) No results for  input(s): "BNP" in the last 8760 hours. Basic Metabolic Panel: Recent Labs  Lab 12/04/21 1648 12/05/21 1814 12/06/21 0207 12/07/21 0139  NA 133*  --  132* 135  K 3.5  --  3.4* 4.1  CL 104  --  103 107  CO2 21*  --  22 22  GLUCOSE 135*  --  120* 90  BUN 11  --  7* 10  CREATININE 1.25* 1.12 1.10 1.14  CALCIUM 8.8*  --  8.7* 8.8*  MG  --   --  2.1 1.9   Liver Function Tests: Recent Labs  Lab 12/04/21 1648  AST 18  ALT 22  ALKPHOS 55  BILITOT 1.1  PROT 7.6  ALBUMIN 4.1   Recent Labs  Lab 12/04/21 1648  LIPASE 40   No results for input(s): "AMMONIA"  in the last 168 hours. CBC: Recent Labs  Lab 12/04/21 1648 12/05/21 1814 12/06/21 0207 12/07/21 0139  WBC 5.9 6.2 5.1 5.3  HGB 13.6 14.5 12.9* 12.2*  HCT 38.3* 41.9 36.9* 35.1*  MCV 90.5 91.7 90.9 92.4  PLT 261 289 263 237   Cardiac Enzymes: No results for input(s): "CKTOTAL", "CKMB", "CKMBINDEX", "TROPONINI" in the last 168 hours. BNP: Invalid input(s): "POCBNP" CBG: No results for input(s): "GLUCAP" in the last 168 hours. D-Dimer No results for input(s): "DDIMER" in the last 72 hours. Hgb A1c Recent Labs    12/05/21 1814  HGBA1C 5.3   Lipid Profile Recent Labs    12/05/21 1814  CHOL 213*  HDL 42  LDLCALC 151*  TRIG 101  CHOLHDL 5.1   Thyroid function studies No results for input(s): "TSH", "T4TOTAL", "T3FREE", "THYROIDAB" in the last 72 hours.  Invalid input(s): "FREET3" Anemia work up No results for input(s): "VITAMINB12", "FOLATE", "FERRITIN", "TIBC", "IRON", "RETICCTPCT" in the last 72 hours. Urinalysis    Component Value Date/Time   COLORURINE AMBER (A) 06/29/2020 1250   APPEARANCEUR CLEAR 06/29/2020 1250   LABSPEC 1.025 06/29/2020 1250   PHURINE 5.5 06/29/2020 1250   GLUCOSEU NEGATIVE 06/29/2020 1250   HGBUR NEGATIVE 06/29/2020 1250   BILIRUBINUR NEGATIVE 06/29/2020 1250   KETONESUR NEGATIVE 06/29/2020 1250   PROTEINUR NEGATIVE 06/29/2020 1250   NITRITE NEGATIVE 06/29/2020 1250    LEUKOCYTESUR NEGATIVE 06/29/2020 1250   Sepsis Labs Recent Labs  Lab 12/04/21 1648 12/05/21 1814 12/06/21 0207 12/07/21 0139  WBC 5.9 6.2 5.1 5.3   Microbiology No results found for this or any previous visit (from the past 240 hour(s)).   Time coordinating discharge:  I have spent 35 minutes face to face with the patient and on the ward discussing the patients care, assessment, plan and disposition with other care givers. >50% of the time was devoted counseling the patient about the risks and benefits of treatment/Discharge disposition and coordinating care.   SIGNED:   Damita Lack, MD  [facility] 12/07/2021, 11:42 AM   If 7PM-7AM, please contact night-coverage

## 2021-12-07 NOTE — Progress Notes (Signed)
Progress Note  Patient Name: Joshua Solis Date of Encounter: 12/07/2021  [facility] Cardiologist: Minus Breeding, MD   Subjective   Underwent EGD yesterday which showed grade A esophagitis, gastritis and nonbleeding duodenal ulcer.  High sensitivity troponin has been flat from 35-24 throughout the admission.  2D echo showed normal LV function with no focal wall motion abnormalities.  This morning he feels the best he is felt in the past few weeks.  He only has minimal epigastric discomfort otherwise anxious to go home.  Inpatient Medications    Scheduled Meds:  amLODipine  10 mg Oral Daily   aspirin EC  81 mg Oral Daily   atorvastatin  80 mg Oral QHS   carvedilol  6.25 mg Oral BID WC   pantoprazole (PROTONIX) IV  40 mg Intravenous Q12H   sertraline  100 mg Oral QHS   tamsulosin  0.4 mg Oral QHS   terazosin  10 mg Oral QHS   traZODone  50 mg Oral QHS   Continuous Infusions:  sodium chloride 75 mL/hr at 12/07/21 0631   PRN Meds: acetaminophen, guaiFENesin, hydrALAZINE, ipratropium-albuterol, metoprolol tartrate, morphine injection, ondansetron **OR** ondansetron (ZOFRAN) IV, oxyCODONE-acetaminophen, senna-docusate   Vital Signs    Vitals:   12/06/21 1721 12/06/21 1949 12/07/21 0623 12/07/21 0812  BP: (!) 152/70 (!) 157/67 139/87 (!) 156/72  Pulse: 66 62 (!) 43 (!) 56  Resp:    16  Temp:  97.6 F (36.4 C) 97.8 F (36.6 C) 97.8 F (36.6 C)  TempSrc:  Oral Oral Oral  SpO2:  99% 100% 99%  Weight:      Height:        Intake/Output Summary (Last 24 hours) at 12/07/2021 0814 Last data filed at 12/07/2021 0631 Gross per 24 hour  Intake 1910.96 ml  Output 1950 ml  Net -39.04 ml      12/06/2021    1:05 PM 12/04/2021    4:43 PM 02/07/2021    7:15 AM  Last 3 Weights  Weight (lbs) 182 lb 185 lb 188 lb 3.2 oz  Weight (kg) 82.555 kg 83.915 kg 85.367 kg      Telemetry    Normal sinus rhythm- Personally Reviewed  ECG    NSR with inferior T wave abnormality  -  Personally Reviewed  Physical Exam   GEN: No acute distress.   Neck: No JVD Cardiac: RRR, no murmurs, rubs, or gallops.  Respiratory: Clear to auscultation bilaterally. GI: Soft, nontender, non-distended  MS: No edema; No deformity. Neuro:  Nonfocal  Psych: Normal affect   Labs    High Sensitivity Troponin:   Recent Labs  Lab 12/04/21 1648 12/04/21 1851 12/05/21 1814 12/05/21 1953 12/06/21 1648  TROPONINIHS 35* 35* 35* 35* 24*      Chemistry Recent Labs  Lab 12/04/21 1648 12/05/21 1814 12/06/21 0207 12/07/21 0139  NA 133*  --  132* 135  K 3.5  --  3.4* 4.1  CL 104  --  103 107  CO2 21*  --  22 22  GLUCOSE 135*  --  120* 90  BUN 11  --  7* 10  CREATININE 1.25* 1.12 1.10 1.14  CALCIUM 8.8*  --  8.7* 8.8*  PROT 7.6  --   --   --   ALBUMIN 4.1  --   --   --   AST 18  --   --   --   ALT 22  --   --   --   [facility]  55  --   --   --   BILITOT 1.1  --   --   --   GFRNONAA >60 >60 >60 >60  ANIONGAP 8  --  7 6     Hematology Recent Labs  Lab 12/05/21 1814 12/06/21 0207 12/07/21 0139  WBC 6.2 5.1 5.3  RBC 4.57 4.06* 3.80*  HGB 14.5 12.9* 12.2*  HCT 41.9 36.9* 35.1*  MCV 91.7 90.9 92.4  MCH 31.7 31.8 32.1  MCHC 34.6 35.0 34.8  RDW 12.5 12.4 12.4  PLT 289 263 237    BNPNo results for input(s): "BNP", "PROBNP" in the last 168 hours.   DDimer No results for input(s): "DDIMER" in the last 168 hours.   CHA2DS2-VASc Score =     This indicates a  % annual risk of stroke. The patient's score is based upon:        Radiology    No results found.  Cardiac Studies   2D echo 12/06/2021 IMPRESSIONS    1. Left ventricular ejection fraction, by estimation, is 60 to 65%. The  left ventricle has normal function. The left ventricle has no regional  wall motion abnormalities. There is moderate left ventricular hypertrophy.  Left ventricular diastolic  parameters were grossly normal.   2. Right ventricular systolic function is normal. The right ventricular   size is normal. Tricuspid regurgitation signal is inadequate for assessing  PA pressure.   3. The mitral valve is grossly normal. Trivial mitral valve  regurgitation. No evidence of mitral stenosis.   4. The aortic valve is tricuspid. There is moderate calcification of the  aortic valve. Aortic valve regurgitation is not visualized. Aortic valve  sclerosis/calcification is present, without any evidence of aortic  stenosis.   5. The inferior vena cava is normal in size with greater than 50%  respiratory variability, suggesting right atrial pressure of 3 mmHg.   Patient Profile     76 y.o. male with a hx of hypertension, CKD stage II, GERD, H. pylori, major depressive disorder, BPH who is being seen 12/06/2021 for the evaluation of elevated troponin at the request of Dr. Reesa Chew.  Assessment & Plan     Elevated troponin -High-sensitivity troponin minimally elevated with flat trend anywhere from 35-24.  This is not consistent with ACS -Admitted with abdominal and epigastric pain that started after eating at [employer] has persisted for a week.  Pain is reproducible with palpation of his epigastric area -EGD yesterday demonstrated a duodenal ulcer as well as gastritis consistent with patient's symptoms -EKG shows old inferior T wave abnormalities with only a new V6 T wave inversion paired to prior EKG -Today he feels significantly better after being started on PPI. -2D echo showed normal LV function with EF 60 to 65% with no focal regional wall motion abnormalities -No further inpatient cardiac work-up at this time -Given his history of abnormal nuclear stress test a few years ago I will have him follow back up with Dr. Percival Spanish.  Could consider getting a coronary calcium score to assess future risk for coronary CTA if he develops chest pain consistent with cardiac etiology  2.  Hypertension -BP controlled on exam -Continue amlodipine 10 mg daily, carvedilol 6.25 mg twice daily, atorvastatin 80  mg daily, Hytrin 10 mg at bedtime  [place] will sign off.   Medication Recommendations: Amlodipine 10 mg daily, carvedilol 6.25 mg twice daily, atorvastatin 10 mg daily, Hytrin 10 mg nightly Other recommendations (labs, testing, etc): Coronary calcium score outpatient  Follow up as an outpatient: Dr. Percival Spanish after coronary calcium score has been completed  For questions or updates, please contact [place] Please consult [email or web address] for contact info under        Signed, Fransico Him, MD  12/07/2021, 8:14 AM

## 2021-12-07 NOTE — Progress Notes (Signed)
Ordered outpatient coronary calcium score at the request of Dr. Radford Pax. Please see her rounding note from today for more information.  Darreld Mclean, PA-C 12/07/2021 10:03 AM

## 2021-12-09 ENCOUNTER — Encounter (HOSPITAL_COMMUNITY): Payer: Self-pay | Admitting: Internal Medicine

## 2021-12-10 LAB — SURGICAL PATHOLOGY

## 2021-12-11 ENCOUNTER — Encounter: Payer: Self-pay | Admitting: Internal Medicine

## 2022-01-06 NOTE — Progress Notes (Unsigned)
Cardiology Office Note   Date:  01/08/2022   ID:  Joshua Solis, DOB 1946-01-15, MRN [id number]  PCP:  [place]  Cardiologist:   Minus Breeding, MD  CC:    Epigastric pain    History of Present Illness: Joshua Solis is a 76 y.o. male who presents for who presents for follow-up of chest pain. He went to the [place] and had a stress perfusion study which demonstrated a small reversible inferior defect. The EF was well-preserved. He was referred to the [place] to have cardiac catheterization. However, when he went to have that done he couldn't find parking so we didn't have the procedure. I saw him for the first time in 2018.  In 2017 he had a hypertensive response to on POET (Plain Old Exercise Treadmill) but no clear evidence of ischemia in 2017.   He had moderate LVH on echo in 2020.   He was in the hospital October 2023 with epigastric discomfort.  I reviewed these records for this visit.    He had a diagnosis of gastritis.  He did have elevated troponin but his pain was thought to be non anginal so there was no cardiac work up in the hospital other than a very minimal trop elevation at 35.   At discharge a calcium score was ordered.    He says he ate some hot wings and that triggered his GI problems.  This has resolved.  He takes care of his wife who has a broken hip and residual from [place].  He is doing a lot of housework.  He also walks for exercise.  With that he denies any cardiovascular symptoms such as chest discomfort, neck or arm discomfort.  He has had no new shortness of breath, PND or orthopnea.  He said no palpitations, presyncope or syncope.   He is a Research scientist (physical sciences).    Past Medical History:  Diagnosis Date   Alcohol abuse    02-02-2021  pt stated drinks 2 beers daily (12 oz) and one pint of gin daily   Arthritis    BPH associated with nocturia    CKD (chronic kidney disease), stage II    a. Cr 1.3 during 09/2015 adm, chronicity unclear at that time; f/u  value 1.18.   Diverticulosis of colon    ED (erectile dysfunction)    Fatty liver    GERD (gastroesophageal reflux disease)    History of gastric ulcer    remote hx   Hyperlipidemia    Hypertensive heart disease without heart failure    cardiologist-- dr Denise Bramblett;   ETT 08-18-2015 in epic, no ischemia and hx stress test @ [facility] per cardiology note small reversible inferior defect;  echo 12-11-2020 in epic ef 60-65%   MDD (major depressive disorder)    PTSD (post-traumatic stress disorder)    Umbilical hernia     Past Surgical History:  Procedure Laterality Date   BIOPSY  12/06/2021   Procedure: BIOPSY;  Surgeon: Sharyn Creamer, MD;  Location: [place];  Service: Gastroenterology;;   ESOPHAGOGASTRODUODENOSCOPY (EGD) WITH PROPOFOL N/A 12/06/2021   Procedure: ESOPHAGOGASTRODUODENOSCOPY (EGD) WITH PROPOFOL;  Surgeon: Sharyn Creamer, MD;  Location: [place];  Service: Gastroenterology;  Laterality: N/A;   INGUINAL HERNIA REPAIR Left 2000   KNEE ARTHROSCOPY W/ MENISCAL REPAIR Right 08/11/2020   @[facility]    UMBILICAL HERNIA REPAIR N/A 02/07/2021   Procedure: HERNIA REPAIR UMBILICAL ADULT WITH MESH;  Surgeon: Stechschulte, Nickola Major, MD;  Location: [facility];  Service: General;  Laterality: N/A;   VITRECTOMY Bilateral 2017   approx     Current Outpatient Medications  Medication Sig Dispense Refill   amLODipine (NORVASC) 10 MG tablet Take 1 tablet (10 mg total) by mouth daily. (Patient taking differently: Take 10 mg by mouth daily.) 90 tablet 3   aspirin (ASPIRIN LOW DOSE) 81 MG EC tablet TAKE 1 TABLET(81 MG) BY MOUTH DAILY (Patient taking differently: 81 mg daily. TAKE 1 TABLET(81 MG) BY MOUTH DAILY) 90 tablet 3   atorvastatin (LIPITOR) 80 MG tablet Take 1 tablet (80 mg total) by mouth at bedtime. 90 tablet 0   calcium carbonate (TUMS - DOSED IN MG ELEMENTAL CALCIUM) 500 MG chewable tablet Chew 1 tablet by mouth daily.     carvedilol (COREG) 6.25 MG tablet Take 1 tablet (6.25  mg total) by mouth 2 (two) times daily. 180 tablet 3   cetirizine (ZYRTEC) 10 MG tablet Take 10 mg by mouth daily.     Multiple Vitamins-Minerals (MENS ONE DAILY PO) Take 1 tablet by mouth daily.     pantoprazole (PROTONIX) 40 MG tablet Take 1 tablet (40 mg total) by mouth 2 (two) times daily before a meal. 60 tablet 0   sertraline (ZOLOFT) 100 MG tablet Take 100 mg by mouth at bedtime.     sildenafil (VIAGRA) 100 MG tablet Take 100 mg by mouth daily as needed for erectile dysfunction.     tamsulosin (FLOMAX) 0.4 MG CAPS capsule Take 0.4 mg by mouth at bedtime.     terazosin (HYTRIN) 10 MG capsule Take 10 mg by mouth at bedtime.     traZODone (DESYREL) 50 MG tablet Take 50 mg by mouth at bedtime.     nitroGLYCERIN (NITROSTAT) 0.4 MG SL tablet Place 0.4 mg under the tongue every 5 (five) minutes as needed for chest pain (x 3 doses). (Patient not taking: Reported on 01/08/2022)     No current facility-administered medications for this visit.    Allergies:   Patient has no known allergies.    ROS:  Please see the history of present illness.   Otherwise, review of systems are positive for none.   All other systems are reviewed and negative.    PHYSICAL EXAM: VS:  BP (!) 154/68 (BP Location: Left Arm, Patient Position: Sitting, Cuff Size: Normal)   Pulse 68   Ht 5\' 8"  (1.727 m)   Wt 183 lb (83 kg)   SpO2 99%   BMI 27.83 kg/m  , BMI Body mass index is 27.83 kg/m. GENERAL:  Well appearing NECK:  No jugular venous distention, waveform within normal limits, carotid upstroke brisk and symmetric, no bruits, no thyromegaly LUNGS:  Clear to auscultation bilaterally CHEST:  Unremarkable HEART:  PMI not displaced or sustained,S1 and S2 within normal limits, no S3, no S4, no clicks, no rubs, no murmurs ABD:  Flat, positive bowel sounds normal in frequency in pitch, no bruits, no rebound, no guarding, no midline pulsatile mass, no hepatomegaly, no splenomegaly EXT:  2 plus pulses throughout, no  edema, no cyanosis no clubbing   EKG:  EKG is not ordered today. The ekg ordered 01/08/22 demonstrates sinus rhythm, rate 64, axis within normal limits, intervals within normal limits, inferior T wave inversions consistent with repolarization changes.  No change from previous   Recent Labs: 12/04/2021: ALT 22 12/07/2021: BUN 10; Creatinine, Ser 1.14; Hemoglobin 12.2; Magnesium 1.9; Platelets 237; Potassium 4.1; Sodium 135    Lipid Panel    Component Value  Date/Time   CHOL 213 (H) 12/05/2021 1814   CHOL 189 08/26/2019 0938   TRIG 101 12/05/2021 1814   HDL 42 12/05/2021 1814   HDL 34 (L) 08/26/2019 0938   CHOLHDL 5.1 12/05/2021 1814   VLDL 20 12/05/2021 1814   LDLCALC 151 (H) 12/05/2021 1814   LDLCALC 125 (H) 08/26/2019 0938      Wt Readings from Last 3 Encounters:  01/08/22 183 lb (83 kg)  12/06/21 182 lb (82.6 kg)  02/07/21 188 lb 3.2 oz (85.4 kg)      Other studies Reviewed: Additional studies/ records that were reviewed today include: Hospital records, labs. Review of the above records demonstrates:  Please see elsewhere in the note.     ASSESSMENT AND PLAN:  LVH:    This was moderate on echo in October.  He has had some difficult to control blood pressures which is most likely the etiology.  I do not strongly suspect an infiltrative cardiomyopathy.  I will follow this with repeat echocardiograms.  He is euvolemic.  HTN:   His blood pressure is elevated.  He has not not gotten a renewal on carvedilol which was ordered on discharge and I will renew this.   DYSLIPIDEMIA:  LDL was 151.  However, he was not taking his Lipitor and he has since restarted this.  ELEVATED TROP: This looks like just an enzyme leak without evidence of acute coronary syndrome but I would like to screen him with a POET (Plain Old Exercise Treadmill).  I realize this may be somewhat difficult to interpret given the baseline EKG changes but we do have an old 1 to compare.  I will be looking for high  risk findings.   Of note he will be on beta-blockers and so might have some trouble getting to his target heart rate but I am looking for high risk findings on meds.  Current medicines are reviewed at length with the patient today.  The patient does not have concerns regarding medicines.  The following changes have been made: As above  Labs/ tests ordered today include:   Orders Placed This Encounter  Procedures   EXERCISE TOLERANCE TEST (ETT)      Disposition:   FU with in me in 1 year   Signed, Rollene Rotunda, MD  01/08/2022 1:36 PM    [facility]

## 2022-01-07 ENCOUNTER — Other Ambulatory Visit (HOSPITAL_COMMUNITY): Payer: Self-pay

## 2022-01-08 ENCOUNTER — Encounter: Payer: Self-pay | Admitting: Cardiology

## 2022-01-08 ENCOUNTER — Ambulatory Visit: Payer: PRIVATE HEALTH INSURANCE | Attending: Cardiology | Admitting: Cardiology

## 2022-01-08 VITALS — BP 154/68 | HR 68 | Ht 68.0 in | Wt 183.0 lb

## 2022-01-08 DIAGNOSIS — I10 Essential (primary) hypertension: Secondary | ICD-10-CM

## 2022-01-08 DIAGNOSIS — R7989 Other specified abnormal findings of blood chemistry: Secondary | ICD-10-CM | POA: Diagnosis not present

## 2022-01-08 DIAGNOSIS — E785 Hyperlipidemia, unspecified: Secondary | ICD-10-CM | POA: Diagnosis not present

## 2022-01-08 DIAGNOSIS — R1013 Epigastric pain: Secondary | ICD-10-CM

## 2022-01-08 DIAGNOSIS — I517 Cardiomegaly: Secondary | ICD-10-CM | POA: Diagnosis not present

## 2022-01-08 MED ORDER — CARVEDILOL 6.25 MG PO TABS: 6.2500 mg | ORAL_TABLET | Freq: Two times a day (BID) | ORAL | 3 refills | Status: DC

## 2022-01-08 NOTE — Patient Instructions (Addendum)
Medication Instructions:  Restart carvedilol (Coreg) 6.25 mg two times daily  *If you need a refill on your cardiac medications before your next appointment, please call your pharmacy*  Testing/Procedures: Your physician has requested that you have an exercise tolerance test. For further information please visit [email or web address]. Please also follow instruction sheet, as given.  Follow-Up: At [facility], you and your health needs are our priority.  As part of our continuing mission to provide you with exceptional heart care, we have created designated Provider Care Teams.  These Care Teams include your primary Cardiologist (physician) and Advanced Practice Providers (APPs -  Physician Assistants and Nurse Practitioners) who all work together to provide you with the care you need, when you need it.  We recommend signing up for the patient portal called "MyChart".  Sign up information is provided on this After Visit Summary.  MyChart is used to connect with patients for Virtual Visits (Telemedicine).  Patients are able to view lab/test results, encounter notes, upcoming appointments, etc.  Non-urgent messages can be sent to your provider as well.   To learn more about what you can do with MyChart, go to [email or web address].    Your next appointment:   12 month(s)  The format for your next appointment:   In Person  Provider:   Minus Breeding, MD

## 2022-01-10 ENCOUNTER — Ambulatory Visit: Payer: PRIVATE HEALTH INSURANCE | Admitting: Internal Medicine

## 2022-02-06 ENCOUNTER — Ambulatory Visit: Payer: PRIVATE HEALTH INSURANCE | Admitting: Internal Medicine

## 2022-02-14 ENCOUNTER — Ambulatory Visit (HOSPITAL_BASED_OUTPATIENT_CLINIC_OR_DEPARTMENT_OTHER): Payer: PRIVATE HEALTH INSURANCE

## 2022-02-14 ENCOUNTER — Encounter (HOSPITAL_COMMUNITY): Payer: Medicare Other

## 2022-02-20 ENCOUNTER — Telehealth (HOSPITAL_COMMUNITY): Payer: Self-pay | Admitting: *Deleted

## 2022-02-20 NOTE — Telephone Encounter (Signed)
Left message for patient with instructions for upcoming ETT.  Joshua Solis

## 2022-02-27 ENCOUNTER — Ambulatory Visit (HOSPITAL_COMMUNITY): Payer: Medicare Other | Attending: Internal Medicine

## 2022-02-27 DIAGNOSIS — R7989 Other specified abnormal findings of blood chemistry: Secondary | ICD-10-CM

## 2022-02-28 LAB — EXERCISE TOLERANCE TEST
Angina Index: 0
Duke Treadmill Score: 4
Estimated workload: 5.2
Exercise duration (min): 3 min
Exercise duration (sec): 33 s
MPHR: 145 {beats}/min
Peak HR: 126 {beats}/min
Percent HR: 86 %
Rest HR: 80 {beats}/min
ST Depression (mm): 0 mm

## 2022-03-05 ENCOUNTER — Encounter: Payer: Self-pay | Admitting: *Deleted

## 2022-06-17 ENCOUNTER — Encounter: Payer: Self-pay | Admitting: *Deleted

## 2022-07-05 ENCOUNTER — Encounter (HOSPITAL_BASED_OUTPATIENT_CLINIC_OR_DEPARTMENT_OTHER): Payer: Self-pay | Admitting: Urology

## 2022-07-05 ENCOUNTER — Emergency Department (HOSPITAL_BASED_OUTPATIENT_CLINIC_OR_DEPARTMENT_OTHER)
Admission: EM | Admit: 2022-07-05 | Discharge: 2022-07-05 | Disposition: A | Discharge status: Home / Self Care | Payer: No Typology Code available for payment source | Attending: Emergency Medicine | Admitting: Emergency Medicine

## 2022-07-05 ENCOUNTER — Other Ambulatory Visit: Payer: Self-pay

## 2022-07-05 DIAGNOSIS — Z7982 Long term (current) use of aspirin: Secondary | ICD-10-CM | POA: Diagnosis not present

## 2022-07-05 DIAGNOSIS — I10 Essential (primary) hypertension: Secondary | ICD-10-CM | POA: Insufficient documentation

## 2022-07-05 DIAGNOSIS — W57XXXA Bitten or stung by nonvenomous insect and other nonvenomous arthropods, initial encounter: Secondary | ICD-10-CM | POA: Insufficient documentation

## 2022-07-05 DIAGNOSIS — Z79899 Other long term (current) drug therapy: Secondary | ICD-10-CM | POA: Diagnosis not present

## 2022-07-05 DIAGNOSIS — S81851A Open bite, right lower leg, initial encounter: Secondary | ICD-10-CM | POA: Diagnosis not present

## 2022-07-05 DIAGNOSIS — S91001A Unspecified open wound, right ankle, initial encounter: Secondary | ICD-10-CM | POA: Diagnosis present

## 2022-07-05 DIAGNOSIS — S80861D Insect bite (nonvenomous), right lower leg, subsequent encounter: Secondary | ICD-10-CM

## 2022-07-05 NOTE — ED Triage Notes (Signed)
Pt states got bit by something on right lateral ankle a few weeks ago and its not healing Reports itching to area  Area has dry scaly skin noted

## 2022-07-05 NOTE — Discharge Instructions (Addendum)
Please continue applying Neosporin or bacitracin to the area.  If you experience any worsening symptoms you may return to the ED.

## 2022-07-05 NOTE — ED Provider Notes (Signed)
[facility] Provider Note   CSN: [id number] Arrival date & time: 07/05/22  1727     History  Chief Complaint  Patient presents with   Insect Bite    Joshua Solis is a 77 y.o. male.  77 y.o male with a PMH of HTN, Alcohol abuse presents to the ED with a chief complaint of of right ankle wound x 3 weeks. Patient reports being in his garden when he got bit by an insect. He reports that day he had a fever and vomited however now symptoms have resolved. He is concerned for infection. Has been applying cortisone to the area, he has been applying cortisone to the area.  He denies any fever, abdominal pain, nausea or vomiting or systemic signs.  The history is provided by the patient.       Home Medications Prior to Admission medications   Medication Sig Start Date End Date Taking? Authorizing Provider  amLODipine (NORVASC) 10 MG tablet Take 1 tablet (10 mg total) by mouth daily. Patient taking differently: Take 10 mg by mouth daily. 10/07/19   Rollene Rotunda, MD  aspirin (ASPIRIN LOW DOSE) 81 MG EC tablet TAKE 1 TABLET(81 MG) BY MOUTH DAILY Patient taking differently: 81 mg daily. TAKE 1 TABLET(81 MG) BY MOUTH DAILY 12/22/20   Rollene Rotunda, MD  atorvastatin (LIPITOR) 80 MG tablet Take 1 tablet (80 mg total) by mouth at bedtime. 12/07/21   Amin, Loura Halt, MD  calcium carbonate (TUMS - DOSED IN MG ELEMENTAL CALCIUM) 500 MG chewable tablet Chew 1 tablet by mouth daily.    [provider]  carvedilol (COREG) 6.25 MG tablet Take 1 tablet (6.25 mg total) by mouth 2 (two) times daily. 01/08/22   Rollene Rotunda, MD  cetirizine (ZYRTEC) 10 MG tablet Take 10 mg by mouth daily.    [provider]  Multiple Vitamins-Minerals (MENS ONE DAILY PO) Take 1 tablet by mouth daily.    [provider]  nitroGLYCERIN (NITROSTAT) 0.4 MG SL tablet Place 0.4 mg under the tongue every 5 (five) minutes as needed for chest pain (x 3  doses). Patient not taking: Reported on 01/08/2022    [provider]  pantoprazole (PROTONIX) 40 MG tablet Take 1 tablet (40 mg total) by mouth 2 (two) times daily before a meal. 12/07/21   Amin, Loura Halt, MD  sertraline (ZOLOFT) 100 MG tablet Take 100 mg by mouth at bedtime.    [provider]  sildenafil (VIAGRA) 100 MG tablet Take 100 mg by mouth daily as needed for erectile dysfunction.    [provider]  tamsulosin (FLOMAX) 0.4 MG CAPS capsule Take 0.4 mg by mouth at bedtime.    [provider]  terazosin (HYTRIN) 10 MG capsule Take 10 mg by mouth at bedtime.    [provider]  traZODone (DESYREL) 50 MG tablet Take 50 mg by mouth at bedtime.    [provider]      Allergies    Patient has no known allergies.    Review of Systems   Review of Systems  Constitutional:  Negative for fever.  Skin:  Positive for wound.    Physical Exam Updated Vital Signs BP (!) 167/70 (BP Location: Left Arm)   Pulse 65   Temp 98.2 F (36.8 C) (Oral)   Resp 18   Ht 5\' 8"  (1.727 m)   Wt 82.6 kg   SpO2 97%   BMI 27.67 kg/m  Physical Exam Vitals  and nursing note reviewed.  Constitutional:      Appearance: Normal appearance.  HENT:     Head: Normocephalic and atraumatic.     Mouth/Throat:     Mouth: Mucous membranes are moist.  Cardiovascular:     Rate and Rhythm: Normal rate.  Pulmonary:     Effort: Pulmonary effort is normal.  Abdominal:     General: Abdomen is flat.  Musculoskeletal:     Cervical back: Normal range of motion and neck supple.  Skin:    General: Skin is warm and dry.     Findings: Rash present. No bruising or erythema.     Comments: Well appearing, non erythematous, scaling skin with no streaking.   Neurological:     Mental Status: He is alert and oriented to person, place, and time.        ED Results / Procedures / Treatments   Labs (all labs ordered are listed, but only abnormal results are  displayed) Labs Reviewed - No data to display  EKG None  Radiology No results found.  Procedures Procedures    Medications Ordered in ED Medications - No data to display  ED Course/ Medical Decision Making/ A&P                             Medical Decision Making   Patient presents to the ED with a chief complaint of right ankle wound x 3 weeks. Reports being bit any what he thinks was a mosquito, the day of he developed a fever, had an episode of nausea and vomiting.  His symptoms have now dissipated.  He reports being concerned as wound still has some discoloration, he has been applying cortisone to the area.  He does not have any systemic signs, no fevers, no chills.  Wound is overall well-appearing, does have scaly patch noted to it, no streaking in the skin, no erythema, no fluctuance.  Palpable DP and PT pulses 2+.  Sensation is intact throughout.  We discussed symptomatic treatment with Neosporin and bacitracin.  Return precautions discussed at length.  Portions of this note were generated with Scientist, clinical (histocompatibility and immunogenetics). Dictation errors may occur despite best attempts at proofreading.  Final Clinical Impression(s) / ED Diagnoses Final diagnoses:  Insect bite of right lower leg, subsequent encounter    Rx / DC Orders ED Discharge Orders     None         Claude Manges, PA-C 07/05/22 1815    Lonell Grandchild, MD 07/06/22 1225

## 2023-01-08 ENCOUNTER — Other Ambulatory Visit: Payer: Self-pay | Admitting: Cardiology

## 2023-02-18 ENCOUNTER — Other Ambulatory Visit: Payer: Self-pay | Admitting: Cardiology

## 2023-09-22 ENCOUNTER — Other Ambulatory Visit (HOSPITAL_COMMUNITY): Payer: Self-pay
